# Patient Record
Sex: Male | Born: 1960 | Race: White | Hispanic: No | Marital: Single | Smoking: Never smoker
Health system: Southern US, Community
[De-identification: ages and names within clinical notes are randomized; demographics above are authoritative.]

## PROBLEM LIST (undated history)

## (undated) DIAGNOSIS — E785 Hyperlipidemia, unspecified: Secondary | ICD-10-CM

## (undated) DIAGNOSIS — Z8249 Family history of ischemic heart disease and other diseases of the circulatory system: Secondary | ICD-10-CM

## (undated) DIAGNOSIS — G4700 Insomnia, unspecified: Secondary | ICD-10-CM

## (undated) DIAGNOSIS — I10 Essential (primary) hypertension: Secondary | ICD-10-CM

## (undated) HISTORY — PX: COLONOSCOPY: SHX174

## (undated) HISTORY — DX: Insomnia, unspecified: G47.00

## (undated) HISTORY — DX: Hyperlipidemia, unspecified: E78.5

## (undated) HISTORY — DX: Essential (primary) hypertension: I10

## (undated) HISTORY — DX: Family history of ischemic heart disease and other diseases of the circulatory system: Z82.49

---

## 2002-08-19 HISTORY — PX: TRANSTHORACIC ECHOCARDIOGRAM: SHX275

## 2003-03-29 ENCOUNTER — Ambulatory Visit (HOSPITAL_COMMUNITY): Admission: RE | Admit: 2003-03-29 | Discharge: 2003-03-29 | Payer: Self-pay | Admitting: Family Medicine

## 2003-03-29 ENCOUNTER — Encounter: Payer: Self-pay | Admitting: Family Medicine

## 2005-05-28 ENCOUNTER — Ambulatory Visit (HOSPITAL_COMMUNITY): Admission: RE | Admit: 2005-05-28 | Discharge: 2005-05-28 | Payer: Self-pay | Admitting: Pediatrics

## 2005-05-31 HISTORY — PX: CARDIAC CATHETERIZATION: SHX172

## 2009-02-16 HISTORY — PX: NM MYOCAR PERF WALL MOTION: HXRAD629

## 2013-02-23 NOTE — H&P (Signed)
  NTS SOAP Note  Vital Signs:  Vitals as of: 02/23/2013: Systolic 124: Diastolic 76: Heart Rate 71: Temp 95.26F: Height 2ft 11in: Weight 189Lbs 0 Ounces: BMI 26.36  BMI : 26.36 kg/m2  Subjective: This 52 Years 50 Months old Male presents for screening TCS and dysphagia.  Never has had a colonoscopy.  No family h/o colon cancer.  Denies any lower gi complaints.  Has had some dysphagia of solid food over the past few months, intermittent, but not worsening.  No emesis noted.  Seems to get stuck in his throat.   Review of Symptoms:  Constitutional:unremarkable   Head:unremarkable    Eyes:unremarkable   Nose/Mouth/Throat:unremarkable Cardiovascular:  unremarkable   Respiratory:unremarkable   Gastrointestinal:    heartburn,dysphagia Genitourinary:unremarkable     Musculoskeletal:unremarkable   Skin:unremarkable Hematolgic/Lymphatic:unremarkable     Allergic/Immunologic:unremarkable     Past Medical History:    Reviewed   Past Medical History  Surgical History: none Medical Problems:  High cholesterol Allergies: nkda Medications: fenofibrate, fish oil, ambien, crestor, baby asa   Social History:Reviewed  Social History  Preferred Language: English Race:  White Ethnicity: Not Hispanic / Latino Age: 52 Years 3 Months Marital Status:  S Alcohol: socially Recreational drug(s):  No   Smoking Status: Never smoker reviewed on 02/23/2013 Functional Status reviewed on mm/dd/yyyy ------------------------------------------------ Bathing: Normal Cooking: Normal Dressing: Normal Driving: Normal Eating: Normal Managing Meds: Normal Oral Care: Normal Shopping: Normal Toileting: Normal Transferring: Normal Walking: Normal Cognitive Status reviewed on mm/dd/yyyy ------------------------------------------------ Attention: Normal Decision Making: Normal Language: Normal Memory: Normal Motor: Normal Perception: Normal Problem  Solving: Normal Visual and Spatial: Normal   Family History:  Reviewed  Family Health History Mother, Living; Heart disease;  Father, Living; Heart disease;     Objective Information: General:  Well appearing, well nourished in no distress. Neck:  Supple without lymphadenopathy.  Heart:  RRR, no murmur Lungs:    CTA bilaterally, no wheezes, rhonchi, rales.  Breathing unlabored. Abdomen:Soft, NT/ND, no HSM, no masses.   deferred to procedure  Assessment:Need for screening TCS, dysphagia  Diagnosis &amp; Procedure Smart Code   Plan:Scheduled for EGD, TCS on 03/09/13.   Patient Education:Alternative treatments to surgery were discussed with patient (and family).  Risks and benefits  of procedure were fully explained to the patient (and family) who gave informed consent. Patient/family questions were addressed.  Follow-up:Pending Surgery

## 2013-03-09 ENCOUNTER — Ambulatory Visit (HOSPITAL_COMMUNITY): Admission: RE | Admit: 2013-03-09 | Payer: BC Managed Care – PPO | Source: Ambulatory Visit | Admitting: General Surgery

## 2013-03-09 ENCOUNTER — Encounter (HOSPITAL_COMMUNITY): Admission: RE | Payer: Self-pay | Source: Ambulatory Visit

## 2013-03-09 SURGERY — COLONOSCOPY
Anesthesia: Moderate Sedation

## 2013-08-09 ENCOUNTER — Other Ambulatory Visit: Payer: Self-pay | Admitting: *Deleted

## 2013-08-09 MED ORDER — ROSUVASTATIN CALCIUM 10 MG PO TABS: 10.0000 mg | ORAL_TABLET | Freq: Every day | ORAL | Status: DC

## 2013-08-09 NOTE — Telephone Encounter (Signed)
Rx was sent to pharmacy electronically. 

## 2013-09-13 ENCOUNTER — Encounter: Payer: Self-pay | Admitting: *Deleted

## 2013-09-20 ENCOUNTER — Encounter: Payer: Self-pay | Admitting: Internal Medicine

## 2013-09-21 ENCOUNTER — Ambulatory Visit (INDEPENDENT_AMBULATORY_CARE_PROVIDER_SITE_OTHER): Payer: BC Managed Care – PPO | Admitting: Internal Medicine

## 2013-09-21 ENCOUNTER — Other Ambulatory Visit: Payer: Self-pay

## 2013-09-21 ENCOUNTER — Encounter: Payer: Self-pay | Admitting: Internal Medicine

## 2013-09-21 VITALS — BP 128/96 | HR 73 | Ht 71.0 in | Wt 189.7 lb

## 2013-09-21 DIAGNOSIS — Z8249 Family history of ischemic heart disease and other diseases of the circulatory system: Secondary | ICD-10-CM

## 2013-09-21 DIAGNOSIS — E785 Hyperlipidemia, unspecified: Secondary | ICD-10-CM

## 2013-09-21 MED ORDER — FENOFIBRATE 145 MG PO TABS: 145.0000 mg | ORAL_TABLET | Freq: Every day | ORAL | Status: DC

## 2013-09-21 NOTE — Patient Instructions (Addendum)
Your physician recommends that you return for lab work at your earliest convenience. We will call you with the results. NMR with lipids - please order this to be done prior to annual visits.   Your physician wants you to follow-up in: 1 year. You will receive a reminder letter in the mail two months in advance. If you don't receive a letter, please call our office to schedule the follow-up appointment.

## 2013-09-21 NOTE — Progress Notes (Signed)
OFFICE NOTE  Chief Complaint:  No complaints  Primary Care Physician: Colette Ribas, MD  HPI:  Russell Watkins is a pleasant 53 year old gentleman with a history of coronary disease in the family, dyslipidemia and hypertension. However, he had marked weight loss and is no longer hypertensive. He has been on Crestor 10 mg which was changed to from pravastatin after a lipid profile in January 2013 which showed LDL particle number of 1486, an LDL cholesterol of 62, triglycerides 246, and LDL particle number of 877. Overall, fairly good control; however, there are still a higher number of LDL particles. He also was started on fenofibrate 145 for the increased triglycerides and seems to be tolerating the medicines well. Overall, he has no complaints. A repeat lipid profile performed in January 2014 demonstrated an LDL particle number of 1073, LDL-C. of 69, HDL-C. 45 and triglycerides at 117. This demonstrates excellent control.  Otherwise he has done well over the past year denies any chest pain or shortness of breath with exertion.  PMHx:  Past Medical History  Diagnosis Date  . Family history of heart disease   . Dyslipidemia   . Hypertension   . Insomnia     Past Surgical History  Procedure Laterality Date  . Transthoracic echocardiogram  2004    RV mildly dilated, RA mildly dilated, mild TR  . Nm myocar perf wall motion  02/2009    bruce myoview; no evidence of significant ischemia, normal pattern of perfusion, post-stress EF 70%  . Cardiac catheterization  05/31/2005    normal LV systolic function, normal coronaries (Dr. Evlyn Courier)    FAMHx:  Family History  Problem Relation Age of Onset  . Coronary artery disease Mother     CABG  . Stroke Paternal Grandfather     SOCHx:   reports that he has never smoked. He has never used smokeless tobacco. He reports that he drinks alcohol. He reports that he does not use illicit drugs.  ALLERGIES:  No Known  Allergies  ROS: A comprehensive review of systems was negative.  HOME MEDS: Current Outpatient Prescriptions  Medication Sig Dispense Refill  . ascorbic acid (VITAMIN C) 500 MG tablet Take 500 mg by mouth daily.      . Cyanocobalamin (VITAMIN B 12 PO) Take 2 tablets by mouth daily.      . Omega-3 Fatty Acids (FISH OIL PO) Take 1-2 capsules by mouth daily.      . rosuvastatin (CRESTOR) 10 MG tablet Take 1 tablet (10 mg total) by mouth at bedtime.  30 tablet  1  . zolpidem (AMBIEN) 10 MG tablet Take 5 mg by mouth at bedtime as needed for sleep.      . fenofibrate (TRICOR) 145 MG tablet Take 1 tablet (145 mg total) by mouth at bedtime.  30 tablet  11   No current facility-administered medications for this visit.    LABS/IMAGING: No results found for this or any previous visit (from the past 48 hour(s)). No results found.  VITALS: BP 128/96  Pulse 73  Ht 5\' 11"  (1.803 m)  Wt 189 lb 11.2 oz (86.047 kg)  BMI 26.47 kg/m2  EXAM: General appearance: alert and no distress Neck: no carotid bruit and no JVD Lungs: clear to auscultation bilaterally Heart: regular rate and rhythm, S1, S2 normal, no murmur, click, rub or gallop Abdomen: soft, non-tender; bowel sounds normal; no masses,  no organomegaly Extremities: extremities normal, atraumatic, no cyanosis or edema Pulses: 2+ and symmetric  Skin: Skin color, texture, turgor normal. No rashes or lesions Neurologic: Grossly normal Psych: Mood, affect normal  EKG: Normal sinus rhythm at 70  ASSESSMENT: 1. Dyslipidemia-at goal 2. Family history of CAD  PLAN: 1.   Russell Watkins is doing well and has no new issues. We will go ahead and recheck a lipid and MR today and contact him with results of that study.  Hopefully we'll not need to adjust his medications.  Plan to see him back annually or sooner is necessary.  Chrystie NoseKenneth C. [name], MD, [facility] CenterFACC Attending Cardiologist [facility]  [name],Kenneth C 09/21/2013, 5:35 PM

## 2013-09-21 NOTE — Telephone Encounter (Signed)
Rx was sent to pharmacy electronically. 

## 2013-10-25 ENCOUNTER — Other Ambulatory Visit: Payer: Self-pay | Admitting: *Deleted

## 2013-10-25 MED ORDER — ROSUVASTATIN CALCIUM 10 MG PO TABS: 10.0000 mg | ORAL_TABLET | Freq: Every day | ORAL | Status: DC

## 2013-10-25 NOTE — Telephone Encounter (Signed)
Rx was sent to pharmacy electronically. 

## 2014-11-13 ENCOUNTER — Other Ambulatory Visit: Payer: Self-pay | Admitting: Internal Medicine

## 2014-11-14 NOTE — Telephone Encounter (Signed)
Rx refill sent to patient pharmacy   

## 2014-12-02 ENCOUNTER — Telehealth: Payer: Self-pay | Admitting: Internal Medicine

## 2014-12-05 NOTE — Telephone Encounter (Signed)
Close encounter 

## 2014-12-08 ENCOUNTER — Other Ambulatory Visit: Payer: Self-pay | Admitting: Internal Medicine

## 2014-12-11 LAB — NMR LIPOPROFILE WITH LIPIDS
CHOLESTEROL, TOTAL: 148 mg/dL (ref 100–199)
HDL PARTICLE NUMBER: 33.4 umol/L (ref >=30.5)
HDL Size: 8.3 nm — ABNORMAL LOW (ref >=9.2)
HDL-C: 47 mg/dL (ref >39)
LARGE VLDL-P: 4.2 nmol/L — AB (ref <=2.7)
LDL CALC: 80 mg/dL (ref 0–99)
LDL PARTICLE NUMBER: 1374 nmol/L — AB (ref <1000)
LDL Size: 20.7 nm (ref >=20.8)
LP-IR Score: 84 — ABNORMAL HIGH (ref <=45)
Large HDL-P: 1.4 umol/L — ABNORMAL LOW (ref >=4.8)
SMALL LDL PARTICLE NUMBER: 554 nmol/L — AB (ref <=527)
Triglycerides: 103 mg/dL (ref 0–149)
VLDL Size: 56.1 nm — ABNORMAL HIGH (ref <=46.6)

## 2014-12-14 ENCOUNTER — Encounter: Payer: Self-pay | Admitting: *Deleted

## 2014-12-20 ENCOUNTER — Other Ambulatory Visit: Payer: Self-pay | Admitting: Internal Medicine

## 2015-01-09 ENCOUNTER — Ambulatory Visit (INDEPENDENT_AMBULATORY_CARE_PROVIDER_SITE_OTHER): Payer: BLUE CROSS/BLUE SHIELD | Admitting: Internal Medicine

## 2015-01-09 ENCOUNTER — Encounter: Payer: Self-pay | Admitting: Internal Medicine

## 2015-01-09 VITALS — BP 110/72 | HR 70 | Ht 71.0 in | Wt 189.5 lb

## 2015-01-09 DIAGNOSIS — E785 Hyperlipidemia, unspecified: Secondary | ICD-10-CM | POA: Diagnosis not present

## 2015-01-09 DIAGNOSIS — Z8249 Family history of ischemic heart disease and other diseases of the circulatory system: Secondary | ICD-10-CM

## 2015-01-09 NOTE — Progress Notes (Signed)
OFFICE NOTE  Chief Complaint:  No complaints  Primary Care Physician: Colette Ribas, MD  HPI:  Russell Watkins is a pleasant 54 year old gentleman with a history of coronary disease in the family, dyslipidemia and hypertension. However, he had marked weight loss and is no longer hypertensive. He has been on Crestor 10 mg which was changed to from pravastatin after a lipid profile in January 2013 which showed LDL particle number of 1486, an LDL cholesterol of 62, triglycerides 246, and LDL particle number of 877. Overall, fairly good control; however, there are still a higher number of LDL particles. He also was started on fenofibrate 145 for the increased triglycerides and seems to be tolerating the medicines well. Overall, he has no complaints. A repeat lipid profile performed in January 2014 demonstrated an LDL particle number of 1073, LDL-C. of 69, HDL-C. 45 and triglycerides at 117. This demonstrates excellent control.  Otherwise he has done well over the past year denies any chest pain or shortness of breath with exertion.  Russell Watkins is doing well overall. Denies a chest pain or shortness of breath. He remains active. Repeat cholesterol testing indicates a slightly higher particle number with LDL-P of 1374, the LDL C is 80, HDL 47 and triglycerides of 103. This is still an overall favorable profile however particle numbers are up. This is primarily due to high the VLDL number. He'll will need to continue to work on diet and exercise. This point is no indication to change his medications.  PMHx:  Past Medical History  Diagnosis Date  . Family history of heart disease   . Dyslipidemia   . Hypertension   . Insomnia     Past Surgical History  Procedure Laterality Date  . Transthoracic echocardiogram  2004    RV mildly dilated, RA mildly dilated, mild TR  . Nm myocar perf wall motion  02/2009    bruce myoview; no evidence of significant ischemia, normal pattern of perfusion,  post-stress EF 70%  . Cardiac catheterization  05/31/2005    normal LV systolic function, normal coronaries (Dr. Evlyn Courier)    FAMHx:  Family History  Problem Relation Age of Onset  . Coronary artery disease Mother     CABG  . Stroke Paternal Grandfather     SOCHx:   reports that he has never smoked. He has never used smokeless tobacco. He reports that he drinks alcohol. He reports that he does not use illicit drugs.  ALLERGIES:  No Known Allergies  ROS: A comprehensive review of systems was negative.  HOME MEDS: Current Outpatient Prescriptions  Medication Sig Dispense Refill  . ascorbic acid (VITAMIN C) 500 MG tablet Take 500 mg by mouth daily.    . CRESTOR 10 MG tablet take 1 tablet by mouth at bedtime 30 tablet 0  . Cyanocobalamin (VITAMIN B 12 PO) Take 2 tablets by mouth daily.    . fenofibrate (TRICOR) 145 MG tablet take 1 tablet by mouth at bedtime 30 tablet 0  . Omega-3 Fatty Acids (FISH OIL PO) Take 1-2 capsules by mouth daily.    Marland Kitchen zolpidem (AMBIEN) 10 MG tablet Take 5 mg by mouth at bedtime as needed for sleep.     No current facility-administered medications for this visit.    LABS/IMAGING: No results found for this or any previous visit (from the past 48 hour(s)). No results found.  VITALS: BP 110/72 mmHg  Pulse 70  Ht  (1.803 m)  Wt 189 lb  8 oz (85.957 kg)  BMI 26.44 kg/m2  EXAM: General appearance: alert and no distress Neck: no carotid bruit and no JVD Lungs: clear to auscultation bilaterally Heart: regular rate and rhythm, S1, S2 normal, no murmur, click, rub or gallop Abdomen: soft, non-tender; bowel sounds normal; no masses,  no organomegaly Extremities: extremities normal, atraumatic, no cyanosis or edema Pulses: 2+ and symmetric Skin: Skin color, texture, turgor normal. No rashes or lesions Neurologic: Grossly normal Psych: Mood, affect normal  EKG: Normal sinus rhythm at 70  ASSESSMENT: 1. Dyslipidemia-at goal 2. Family history  of CAD  PLAN: 1.   Russell Watkins is doing well and has no new issues. We will go ahead and recheck a lipid NMR today prior to his visit in 1 year. Hopefully we'll not need to adjust his medications.  Plan to see him back annually or sooner is necessary.  Chrystie NoseKenneth C. Evie Croston, MD, [facility]ACC Attending Cardiologist [facility]  Chrystie NoseKenneth C Byran Bilotti 01/09/2015, 9:54 AM

## 2015-01-09 NOTE — Patient Instructions (Signed)
Your physician wants you to follow-up in: 1 year with Dr. Rennis GoldenHilty. You will receive a reminder letter in the mail two months in advance. If you don't receive a letter, please call our office to schedule the follow-up appointment.  >> please have fasting labs in 1 year prior to your next visit

## 2015-02-07 ENCOUNTER — Other Ambulatory Visit: Payer: Self-pay | Admitting: Internal Medicine

## 2015-11-09 ENCOUNTER — Other Ambulatory Visit: Payer: Self-pay | Admitting: *Deleted

## 2015-11-09 DIAGNOSIS — E785 Hyperlipidemia, unspecified: Secondary | ICD-10-CM

## 2015-12-14 ENCOUNTER — Other Ambulatory Visit: Payer: Self-pay | Admitting: Internal Medicine

## 2015-12-14 NOTE — Telephone Encounter (Signed)
Rx has been sent to the pharmacy electronically. ° °

## 2016-01-25 ENCOUNTER — Telehealth: Payer: Self-pay | Admitting: Internal Medicine

## 2016-01-25 NOTE — Telephone Encounter (Signed)
Error

## 2016-02-09 DIAGNOSIS — E785 Hyperlipidemia, unspecified: Secondary | ICD-10-CM | POA: Diagnosis not present

## 2016-02-13 ENCOUNTER — Ambulatory Visit (INDEPENDENT_AMBULATORY_CARE_PROVIDER_SITE_OTHER): Payer: BLUE CROSS/BLUE SHIELD | Admitting: Internal Medicine

## 2016-02-13 ENCOUNTER — Encounter: Payer: Self-pay | Admitting: Internal Medicine

## 2016-02-13 VITALS — BP 102/74 | HR 69 | Ht 71.0 in | Wt 190.2 lb

## 2016-02-13 DIAGNOSIS — E785 Hyperlipidemia, unspecified: Secondary | ICD-10-CM

## 2016-02-13 DIAGNOSIS — Z8249 Family history of ischemic heart disease and other diseases of the circulatory system: Secondary | ICD-10-CM

## 2016-02-13 LAB — CARDIO IQ(R) ADVANCED LIPID PANEL
Apolipoprotein B: 87 mg/dL (ref 52–109)
Cholesterol, Total: 160 mg/dL (ref 125–200)
Cholesterol/HDL Ratio: 3.8 calc (ref <=5.0)
HDL Cholesterol: 42 mg/dL (ref >=40)
LDL Large: 3254 nmol/L — ABNORMAL LOW (ref 4334–10815)
LDL Medium: 281 nmol/L (ref 167–465)
LDL Particle Number: 1032 nmol/L (ref 1016–2185)
LDL Peak Size: 217.4 Angstrom — ABNORMAL LOW (ref >=218.2)
LDL Small: 229 nmol/L (ref 123–441)
LDL, Calculated: 100 mg/dL
LIPOPROTEIN (A) (CARDIO IQ ADV LIPID PANEL): 276 nmol/L — AB (ref <75)
Non-HDL Cholesterol: 118 mg/dL
Triglycerides: 92 mg/dL

## 2016-02-13 NOTE — Patient Instructions (Signed)
Medication Instructions:  Continue current medications  Labwork: Fasting Lipids  Testing/Procedures: NONE  Follow-Up: Your physician wants you to follow-up in: 1 Year. You will receive a reminder letter in the mail two months in advance. If you don't receive a letter, please call our office to schedule the follow-up appointment.   Any Other Special Instructions Will Be Listed Below (If Applicable).   If you need a refill on your cardiac medications before your next appointment, please call your pharmacy.   

## 2016-02-13 NOTE — Progress Notes (Signed)
OFFICE NOTE  Chief Complaint:  No complaints  Primary Care Physician: Colette RibasGOLDING, JOHN CABOT, MD  HPI:  Russell Watkins is a pleasant [age] year old gentleman with a history of coronary disease in the family, dyslipidemia and hypertension. However, he had marked weight loss and is no longer hypertensive. He has been on Crestor 10 mg which was changed to from pravastatin after a lipid profile in January 2013 which showed LDL particle number of 1486, an LDL cholesterol of 62, triglycerides 246, and LDL particle number of 877. Overall, fairly good control; however, there are still a higher number of LDL particles. He also was started on fenofibrate 145 for the increased triglycerides and seems to be tolerating the medicines well. Overall, he has no complaints. A repeat lipid profile performed in January 2014 demonstrated an LDL particle number of 1073, LDL-C. of 69, HDL-C. 45 and triglycerides at 117. This demonstrates excellent control.  Otherwise he has done well over the past year denies any chest pain or shortness of breath with exertion.  Russell Watkins is doing well overall. Denies a chest pain or shortness of breath. He remains active. Repeat cholesterol testing indicates a slightly higher particle number with LDL-P of 1374, the LDL C is 80, HDL 47 and triglycerides of 103. This is still an overall favorable profile however particle numbers are up. This is primarily due to high the VLDL number. He'll will need to continue to work on diet and exercise. This point is no indication to change his medications.  02/13/2016  Russell Watkins was seen back in the office today for follow-up. Overall he seems to be doing quite well. He denies any chest pain or worsening shortness of breath. He maintains an active lifestyle. He recently had a lipid profile drawn on 02/09/2016 however those results are still pending. There have been no new occurrences with regards to his health history over the past year.  PMHx:    Past Medical History  Diagnosis Date  . Family history of heart disease   . Dyslipidemia   . Hypertension   . Insomnia     Past Surgical History  Procedure Laterality Date  . Transthoracic echocardiogram  2004    RV mildly dilated, RA mildly dilated, mild TR  . Nm myocar perf wall motion  02/2009    bruce myoview; no evidence of significant ischemia, normal pattern of perfusion, post-stress EF 70%  . Cardiac catheterization  05/31/2005    normal LV systolic function, normal coronaries (Dr. Evlyn CourierJ. Gangi)    FAMHx:  Family History  Problem Relation Age of Onset  . Coronary artery disease Mother     CABG  . Stroke Paternal Grandfather     SOCHx:   reports that he has never smoked. He has never used smokeless tobacco. He reports that he drinks alcohol. He reports that he does not use illicit drugs.  ALLERGIES:  No Known Allergies  ROS: A comprehensive review of systems was negative.  HOME MEDS: Current Outpatient Prescriptions  Medication Sig Dispense Refill  . ascorbic acid (VITAMIN C) 500 MG tablet Take 500 mg by mouth daily.    . Cyanocobalamin (VITAMIN B 12 PO) Take 2 tablets by mouth daily.    . fenofibrate (TRICOR) 145 MG tablet take 1 tablet by mouth at bedtime 30 tablet 1  . Omega-3 Fatty Acids (FISH OIL PO) Take 1-2 capsules by mouth daily.    . rosuvastatin (CRESTOR) 10 MG tablet take 1 tablet by mouth at bedtime 30  tablet 1  . zolpidem (AMBIEN) 10 MG tablet Take 5 mg by mouth at bedtime as needed for sleep.     No current facility-administered medications for this visit.    LABS/IMAGING: No results found for this or any previous visit (from the past 48 hour(s)). No results found.  VITALS: BP 102/74 mmHg  Pulse 69  Ht 5\' 11"  (1.803 m)  Wt 190 lb 3.2 oz (86.274 kg)  BMI 26.54 kg/m2  SpO2 98%  EXAM: General appearance: alert and no distress Neck: no carotid bruit and no JVD Lungs: clear to auscultation bilaterally Heart: regular rate and rhythm, S1, S2  normal, no murmur, click, rub or gallop Abdomen: soft, non-tender; bowel sounds normal; no masses,  no organomegaly Extremities: extremities normal, atraumatic, no cyanosis or edema Pulses: 2+ and symmetric Skin: Skin color, texture, turgor normal. No rashes or lesions Neurologic: Grossly normal Psych: Mood, affect normal  EKG: Normal sinus rhythm at 69  ASSESSMENT: 1. Dyslipidemia-lipids pending 2. Family history of CAD  PLAN: 1.   Russell Watkins is doing well and has no new issues. We will go ahead and recheck a lipid NMR today prior to his visit in 1 year. I will contact him with results of the most recent lipid profile which are still pending. Hopefully we'll not need to adjust his medications.  Plan to see him back annually or sooner is necessary.  Chrystie NoseKenneth C. [name], MD, [facility]ACC Attending Cardiologist [facility]  Chrystie NoseKenneth C [name] 02/13/2016, 8:52 AM

## 2016-02-14 ENCOUNTER — Encounter: Payer: Self-pay | Admitting: Internal Medicine

## 2016-02-14 ENCOUNTER — Other Ambulatory Visit: Payer: Self-pay | Admitting: *Deleted

## 2016-02-14 DIAGNOSIS — E785 Hyperlipidemia, unspecified: Secondary | ICD-10-CM

## 2016-02-29 ENCOUNTER — Other Ambulatory Visit: Payer: Self-pay | Admitting: Internal Medicine

## 2016-03-06 ENCOUNTER — Other Ambulatory Visit: Payer: Self-pay | Admitting: Internal Medicine

## 2016-05-09 DIAGNOSIS — H524 Presbyopia: Secondary | ICD-10-CM | POA: Diagnosis not present

## 2016-05-13 DIAGNOSIS — Z23 Encounter for immunization: Secondary | ICD-10-CM | POA: Diagnosis not present

## 2016-06-11 DIAGNOSIS — Z23 Encounter for immunization: Secondary | ICD-10-CM | POA: Diagnosis not present

## 2016-07-04 DIAGNOSIS — E782 Mixed hyperlipidemia: Secondary | ICD-10-CM | POA: Diagnosis not present

## 2016-07-04 DIAGNOSIS — Z1389 Encounter for screening for other disorder: Secondary | ICD-10-CM | POA: Diagnosis not present

## 2016-07-04 DIAGNOSIS — E663 Overweight: Secondary | ICD-10-CM | POA: Diagnosis not present

## 2016-07-04 DIAGNOSIS — Z0000 Encounter for general adult medical examination without abnormal findings: Secondary | ICD-10-CM | POA: Diagnosis not present

## 2016-07-04 DIAGNOSIS — Z6827 Body mass index (BMI) 27.0-27.9, adult: Secondary | ICD-10-CM | POA: Diagnosis not present

## 2016-07-26 ENCOUNTER — Other Ambulatory Visit: Payer: Self-pay | Admitting: Internal Medicine

## 2016-07-26 NOTE — Telephone Encounter (Signed)
Rx(s) sent to pharmacy electronically.  

## 2016-12-23 ENCOUNTER — Telehealth: Payer: Self-pay | Admitting: *Deleted

## 2016-12-23 DIAGNOSIS — E785 Hyperlipidemia, unspecified: Secondary | ICD-10-CM

## 2016-12-23 NOTE — Telephone Encounter (Signed)
Patient called requesting labs prior to follow up ov Per last office note ok to get labs prior to ov, order place for Cardio [facility]

## 2016-12-26 DIAGNOSIS — E785 Hyperlipidemia, unspecified: Secondary | ICD-10-CM | POA: Diagnosis not present

## 2016-12-30 ENCOUNTER — Ambulatory Visit (INDEPENDENT_AMBULATORY_CARE_PROVIDER_SITE_OTHER): Payer: BLUE CROSS/BLUE SHIELD | Admitting: Internal Medicine

## 2016-12-30 ENCOUNTER — Encounter: Payer: Self-pay | Admitting: Internal Medicine

## 2016-12-30 VITALS — BP 119/79 | HR 71 | Ht 70.5 in | Wt 192.4 lb

## 2016-12-30 DIAGNOSIS — E785 Hyperlipidemia, unspecified: Secondary | ICD-10-CM | POA: Diagnosis not present

## 2016-12-30 DIAGNOSIS — J302 Other seasonal allergic rhinitis: Secondary | ICD-10-CM

## 2016-12-30 DIAGNOSIS — Z8249 Family history of ischemic heart disease and other diseases of the circulatory system: Secondary | ICD-10-CM | POA: Diagnosis not present

## 2016-12-30 NOTE — Patient Instructions (Signed)
Medication Instructions: Your physician recommends that you continue on your current medications as directed. Please refer to the Current Medication list given to you today.  Use over-the-counter Flonase nasal spray for seasonal allergies--follow instructions as given on the bottle.  Follow-Up: Your physician wants you to follow-up in: 1 year with Dr. Rennis GoldenHilty. You will receive a reminder letter in the mail two months in advance. If you don't receive a letter, please call our office to schedule the follow-up appointment.  If you need a refill on your cardiac medications before your next appointment, please call your pharmacy.

## 2016-12-30 NOTE — Progress Notes (Signed)
OFFICE NOTE  Chief Complaint:  Sneezing, seasonal allergies  Primary Care Physician: Assunta Found, MD  HPI:  Russell Watkins is a pleasant 56 year old gentleman with a history of coronary disease in the family, dyslipidemia and hypertension. However, he had marked weight loss and is no longer hypertensive. He has been on Crestor 10 mg which was changed to from pravastatin after a lipid profile in January 2013 which showed LDL particle number of 1486, an LDL cholesterol of 62, triglycerides 246, and LDL particle number of 877. Overall, fairly good control; however, there are still a higher number of LDL particles. He also was started on fenofibrate 145 for the increased triglycerides and seems to be tolerating the medicines well. Overall, he has no complaints. A repeat lipid profile performed in January 2014 demonstrated an LDL particle number of 1073, LDL-C. of 69, HDL-C. 45 and triglycerides at 117. This demonstrates excellent control.  Otherwise he has done well over the past year denies any chest pain or shortness of breath with exertion.  Russell Watkins is doing well overall. Denies a chest pain or shortness of breath. He remains active. Repeat cholesterol testing indicates a slightly higher particle number with LDL-P of 1374, the LDL C is 80, HDL 47 and triglycerides of 103. This is still an overall favorable profile however particle numbers are up. This is primarily due to high the VLDL number. He'll will need to continue to work on diet and exercise. This point is no indication to change his medications.  02/13/2016  Russell Watkins was seen back in the office today for follow-up. Overall he seems to be doing quite well. He denies any chest pain or worsening shortness of breath. He maintains an active lifestyle. He recently had a lipid profile drawn on 02/09/2016 however those results are still pending. There have been no new occurrences with regards to his health history over the past  year.  12/30/2016  Russell Watkins returns today for follow-up. Overall seems to be doing very well. He suffering from some seasonal allergies including itchy eyes and runny nose. He recently had repeat lab work looking at his cholesterol profile however that still pending. Otherwise is asymptomatic. Denies any chest pain or worsening shortness of breath with exertion. Weight is been fairly stable although he wants to lose more weight. He intends to start more regular exercise as well as playing more golf.  PMHx:  Past Medical History:  Diagnosis Date  . Dyslipidemia   . Family history of heart disease   . Hypertension   . Insomnia     Past Surgical History:  Procedure Laterality Date  . CARDIAC CATHETERIZATION  05/31/2005   normal LV systolic function, normal coronaries (Dr. Evlyn Courier)  . NM MYOCAR PERF WALL MOTION  02/2009   bruce myoview; no evidence of significant ischemia, normal pattern of perfusion, post-stress EF 70%  . TRANSTHORACIC ECHOCARDIOGRAM  2004   RV mildly dilated, RA mildly dilated, mild TR    FAMHx:  Family History  Problem Relation Age of Onset  . Coronary artery disease Mother        CABG  . Stroke Paternal Grandfather     SOCHx:   reports that he has never smoked. He has never used smokeless tobacco. He reports that he drinks alcohol. He reports that he does not use drugs.  ALLERGIES:  No Known Allergies  ROS: Pertinent items noted in HPI and remainder of comprehensive ROS otherwise negative.  HOME MEDS: Current Outpatient Prescriptions  Medication Sig Dispense Refill  . ascorbic acid (VITAMIN C) 500 MG tablet Take 500 mg by mouth daily.    . Cyanocobalamin (VITAMIN B 12 PO) Take 2 tablets by mouth daily.    . fenofibrate (TRICOR) 145 MG tablet take 1 tablet by mouth once daily at bedtime 30 tablet 6  . Omega-3 Fatty Acids (FISH OIL PO) Take 1-2 capsules by mouth daily.    . rosuvastatin (CRESTOR) 10 MG tablet take 1 tablet by mouth once daily at  bedtime 30 tablet 6  . zolpidem (AMBIEN) 10 MG tablet Take 5 mg by mouth at bedtime as needed for sleep.     No current facility-administered medications for this visit.     LABS/IMAGING: No results found for this or any previous visit (from the past 48 hour(s)). No results found.  VITALS: BP 119/79   Pulse 71   Ht 5' 10.5" (1.791 m)   Wt 192 lb 6.4 oz (87.3 kg)   BMI 27.22 kg/m   EXAM: General appearance: alert, no distress and thin Neck: no carotid bruit, no JVD and thyroid not enlarged, symmetric, no tenderness/mass/nodules Lungs: clear to auscultation bilaterally Heart: regular rate and rhythm, S1, S2 normal, no murmur, click, rub or gallop Abdomen: soft, non-tender; bowel sounds normal; no masses,  no organomegaly Extremities: extremities normal, atraumatic, no cyanosis or edema Pulses: 2+ and symmetric Skin: Skin color, texture, turgor normal. No rashes or lesions Neurologic: Mental status: Alert, oriented, thought content appropriate Psych: Pleasant  EKG: Deferred  ASSESSMENT: 1. Dyslipidemia-lipids pending 2. Family history of CAD 3. Seasonal allergic rhinitis  PLAN: 1.   Russell Watkins is doing well and has no new issues. He recently had lab work on for his cholesterol which will call him about when the results are available. Otherwise we'll continue his current medications. He has no new cardiac vascular symptoms. He suffering from seasonal allergic rhinitis. I suggested using Flonase nasal spray which is over-the-counter and if he has significant allergies considering an over-the-counter antihistamine such as Allegra.  Follow-up annually or sooner as needed.  Russell NoseKenneth C. [name], MD, [facility] Attending Cardiologist [facility]  Russell NoseKenneth C Watkins 12/30/2016, 9:47 AM

## 2017-01-01 LAB — CARDIO IQ(R) ADVANCED LIPID PANEL
APOLIPOPROTEIN (CARDIO IQ ADV LIPID PANEL): 73 mg/dL (ref 52–109)
CHOLESTEROL, TOTAL (CARDIO IQ ADV LIPID PANEL): 137 mg/dL (ref <200)
CHOLESTEROL/HDL RATIO (CARDIO IQ ADV LIPID PANEL): 3.3 calc (ref <5.0)
HDL CHOLESTEROL (CARDIO IQ ADV LIPID PANEL): 41 mg/dL (ref >40)
LDL CHOLESTEROL CALCULATED (CARDIO IQ ADV LIPID PANEL): 79 mg/dL (ref <100)
LDL Large: 4027 nmol/L (ref 3382–9376)
LDL Medium: 233 nmol/L (ref 122–498)
LDL PARTICLE NUMBER: 1058 nmol/L (ref 732–2035)
LDL Peak Size: 222.3 Angstrom (ref >=217.4)
LDL Small: 173 nmol/L (ref 85–473)
LIPOPROTEIN (A) (CARDIO IQ ADV LIPID PANEL): 282 nmol/L — AB (ref <75)
NON-HDL CHOLESTEROL (CARDIO IQ ADV LIPID PANEL): 96 mg/dL (ref <130)
TRIGLYCERIDES (CARDIO IQ ADV LIPID PANEL): 89 mg/dL (ref <150)

## 2017-01-03 ENCOUNTER — Encounter: Payer: Self-pay | Admitting: Internal Medicine

## 2017-04-09 ENCOUNTER — Other Ambulatory Visit: Payer: Self-pay | Admitting: Internal Medicine

## 2017-05-02 DIAGNOSIS — E663 Overweight: Secondary | ICD-10-CM | POA: Diagnosis not present

## 2017-05-02 DIAGNOSIS — E782 Mixed hyperlipidemia: Secondary | ICD-10-CM | POA: Diagnosis not present

## 2017-05-02 DIAGNOSIS — G4700 Insomnia, unspecified: Secondary | ICD-10-CM | POA: Diagnosis not present

## 2017-05-02 DIAGNOSIS — Z6827 Body mass index (BMI) 27.0-27.9, adult: Secondary | ICD-10-CM | POA: Diagnosis not present

## 2017-09-17 DIAGNOSIS — G4700 Insomnia, unspecified: Secondary | ICD-10-CM | POA: Diagnosis not present

## 2017-09-17 DIAGNOSIS — E663 Overweight: Secondary | ICD-10-CM | POA: Diagnosis not present

## 2017-09-17 DIAGNOSIS — Z6827 Body mass index (BMI) 27.0-27.9, adult: Secondary | ICD-10-CM | POA: Diagnosis not present

## 2017-09-17 DIAGNOSIS — E782 Mixed hyperlipidemia: Secondary | ICD-10-CM | POA: Diagnosis not present

## 2017-09-19 DIAGNOSIS — K219 Gastro-esophageal reflux disease without esophagitis: Secondary | ICD-10-CM | POA: Diagnosis not present

## 2017-09-19 DIAGNOSIS — E782 Mixed hyperlipidemia: Secondary | ICD-10-CM | POA: Diagnosis not present

## 2017-09-19 DIAGNOSIS — Z0000 Encounter for general adult medical examination without abnormal findings: Secondary | ICD-10-CM | POA: Diagnosis not present

## 2017-09-19 DIAGNOSIS — R7309 Other abnormal glucose: Secondary | ICD-10-CM | POA: Diagnosis not present

## 2017-09-19 DIAGNOSIS — Z1389 Encounter for screening for other disorder: Secondary | ICD-10-CM | POA: Diagnosis not present

## 2018-02-02 ENCOUNTER — Other Ambulatory Visit: Payer: Self-pay | Admitting: Internal Medicine

## 2018-03-12 ENCOUNTER — Other Ambulatory Visit: Payer: Self-pay | Admitting: Internal Medicine

## 2018-03-15 ENCOUNTER — Encounter: Payer: Self-pay | Admitting: Internal Medicine

## 2018-03-31 ENCOUNTER — Other Ambulatory Visit: Payer: Self-pay

## 2018-03-31 MED ORDER — ROSUVASTATIN CALCIUM 10 MG PO TABS: 10.0000 mg | ORAL_TABLET | Freq: Every day | ORAL | 1 refills | Status: DC

## 2018-03-31 MED ORDER — FENOFIBRATE 145 MG PO TABS: 145.0000 mg | ORAL_TABLET | Freq: Every day | ORAL | 1 refills | Status: DC

## 2018-04-20 DIAGNOSIS — E785 Hyperlipidemia, unspecified: Secondary | ICD-10-CM

## 2018-05-05 DIAGNOSIS — E785 Hyperlipidemia, unspecified: Secondary | ICD-10-CM | POA: Diagnosis not present

## 2018-05-06 LAB — NMR, LIPOPROFILE
Cholesterol, Total: 145 mg/dL (ref 100–199)
HDL Particle Number: 36.3 umol/L (ref >=30.5)
HDL-C: 48 mg/dL (ref >39)
LDL PARTICLE NUMBER: 1119 nmol/L — AB (ref <1000)
LDL SIZE: 20.3 nm — AB (ref >20.5)
LDL-C: 85 mg/dL (ref 0–99)
LP-IR SCORE: 63 — AB (ref <=45)
SMALL LDL PARTICLE NUMBER: 664 nmol/L — AB (ref <=527)
TRIGLYCERIDES: 61 mg/dL (ref 0–149)

## 2018-05-07 ENCOUNTER — Ambulatory Visit: Payer: BLUE CROSS/BLUE SHIELD | Admitting: Internal Medicine

## 2018-05-07 ENCOUNTER — Encounter: Payer: Self-pay | Admitting: Internal Medicine

## 2018-05-07 VITALS — BP 104/76 | HR 57 | Ht 71.0 in | Wt 182.0 lb

## 2018-05-07 DIAGNOSIS — E785 Hyperlipidemia, unspecified: Secondary | ICD-10-CM

## 2018-05-07 DIAGNOSIS — Z8249 Family history of ischemic heart disease and other diseases of the circulatory system: Secondary | ICD-10-CM

## 2018-05-07 MED ORDER — FENOFIBRATE 145 MG PO TABS: 145.0000 mg | ORAL_TABLET | Freq: Every day | ORAL | 3 refills | Status: DC

## 2018-05-07 MED ORDER — ROSUVASTATIN CALCIUM 10 MG PO TABS: 10.0000 mg | ORAL_TABLET | Freq: Every day | ORAL | 3 refills | Status: DC

## 2018-05-07 NOTE — Patient Instructions (Signed)
Medication Instructions:  Your physician recommends that you continue on your current medications as directed. Please refer to the Current Medication list given to you today.  Follow-Up: Your physician wants you to follow-up in: 12 months with Dr. [name].  You will receive a reminder letter in the mail two months in advance. If you don't receive a letter, please call our office to schedule the follow-up appointment.   Any Other Special Instructions Will Be Listed Below (If Applicable).     If you need a refill on your cardiac medications before your next appointment, please call your pharmacy.   

## 2018-05-07 NOTE — Progress Notes (Signed)
OFFICE NOTE  Chief Complaint:  No complaints  Primary Care Physician: Assunta Found, MD  HPI:  Russell Watkins is a pleasant 57 year old gentleman with a history of coronary disease in the family, dyslipidemia and hypertension. However, he had marked weight loss and is no longer hypertensive. He has been on Crestor 10 mg which was changed to from pravastatin after a lipid profile in January 2013 which showed LDL particle number of 1486, an LDL cholesterol of 62, triglycerides 246, and LDL particle number of 877. Overall, fairly good control; however, there are still a higher number of LDL particles. He also was started on fenofibrate 145 for the increased triglycerides and seems to be tolerating the medicines well. Overall, he has no complaints. A repeat lipid profile performed in January 2014 demonstrated an LDL particle number of 1073, LDL-C. of 69, HDL-C. 45 and triglycerides at 117. This demonstrates excellent control.  Otherwise he has done well over the past year denies any chest pain or shortness of breath with exertion.  Russell Watkins is doing well overall. Denies a chest pain or shortness of breath. He remains active. Repeat cholesterol testing indicates a slightly higher particle number with LDL-P of 1374, the LDL C is 80, HDL 47 and triglycerides of 103. This is still an overall favorable profile however particle numbers are up. This is primarily due to high the VLDL number. He'll will need to continue to work on diet and exercise. This point is no indication to change his medications.  02/13/2016  Russell Watkins was seen back in the office today for follow-up. Overall he seems to be doing quite well. He denies any chest pain or worsening shortness of breath. He maintains an active lifestyle. He recently had a lipid profile drawn on 02/09/2016 however those results are still pending. There have been no new occurrences with regards to his health history over the past year.  12/30/2016  Russell Watkins returns today for follow-up. Overall seems to be doing very well. He suffering from some seasonal allergies including itchy eyes and runny nose. He recently had repeat lab work looking at his cholesterol profile however that still pending. Otherwise is asymptomatic. Denies any chest pain or worsening shortness of breath with exertion. Weight is been fairly stable although he wants to lose more weight. He intends to start more regular exercise as well as playing more golf.  05/07/2018  Russell Watkins is seen today in follow-up.  This is an annual visit and is without complaints.  He recently moved and is working on setting up home gym.  He says that his diet has not been as adequate recently.  We did repeat recent lipid testing including a lipoprotein NMR.  This indicates slightly worse cholesterol numbers however still reasonably good control.  His LDL-P was 1000, 119.  LDL-C was 85, HDL-C 48, triglycerides 61 and he is a small LDL particle number was 664.  He reports compliance with fenofibrate and rosuvastatin.  His diet may not be as healthy as it had been last year.  PMHx:  Past Medical History:  Diagnosis Date  . Dyslipidemia   . Family history of heart disease   . Hypertension   . Insomnia     Past Surgical History:  Procedure Laterality Date  . CARDIAC CATHETERIZATION  05/31/2005   normal LV systolic function, normal coronaries (Dr. Evlyn Courier)  . NM MYOCAR PERF WALL MOTION  02/2009   bruce myoview; no evidence of significant ischemia, normal pattern  of perfusion, post-stress EF 70%  . TRANSTHORACIC ECHOCARDIOGRAM  2004   RV mildly dilated, RA mildly dilated, mild TR    FAMHx:  Family History  Problem Relation Age of Onset  . Coronary artery disease Mother        CABG  . Stroke Paternal Grandfather     SOCHx:   reports that he has never smoked. He has never used smokeless tobacco. He reports that he drinks alcohol. He reports that he does not use drugs.  ALLERGIES:  No  Known Allergies  ROS: Pertinent items noted in HPI and remainder of comprehensive ROS otherwise negative.  HOME MEDS: Current Outpatient Medications  Medication Sig Dispense Refill  . ascorbic acid (VITAMIN C) 500 MG tablet Take 500 mg by mouth daily.    . Cyanocobalamin (VITAMIN B 12 PO) Take 2 tablets by mouth daily.    . fenofibrate (TRICOR) 145 MG tablet Take 1 tablet (145 mg total) by mouth at bedtime. 30 tablet 1  . Omega-3 Fatty Acids (FISH OIL PO) Take 1-2 capsules by mouth daily.    . rosuvastatin (CRESTOR) 10 MG tablet Take 1 tablet (10 mg total) by mouth at bedtime. 30 tablet 1  . zolpidem (AMBIEN) 10 MG tablet Take 5 mg by mouth at bedtime as needed for sleep.     No current facility-administered medications for this visit.     LABS/IMAGING: Results for orders placed or performed in visit on 04/20/18 (from the past 48 hour(s))  NMR, lipoprofile     Status: Abnormal   Collection Time: 05/05/18  8:21 AM  Result Value Ref Range   LDL Particle Number 1,[id number] (H) <1,000 nmol/L    Comment:                           Low                   < 1000                           Moderate         1000 - 1299                           Borderline-High  1300 - 1599                           High             1600 - 2000                           Very High             > 2000    LDL-C 85 0 - 99 mg/dL    Comment:                           Optimal               <  100                           Above optimal     100 -  129  Borderline        130 -  159                           High              160 -  189                           Very high             >  189 LDL-C is inaccurate if patient is non-fasting.    HDL-C 48 >39 mg/dL   Triglycerides 61 0 - 149 mg/dL   Cholesterol, Total 161 100 - 199 mg/dL   HDL Particle Number [id number] >=30.5 umol/L   Small LDL Particle Number 664 (H) <=527 nmol/L   LDL Size 20.3 (L) >20.5 nm    Comment:   ----------------------------------------------------------                  ** INTERPRETATIVE INFORMATION**                  PARTICLE CONCENTRATION AND SIZE                     <--Lower CVD Risk   Higher CVD Risk-->   LDL AND HDL PARTICLES   Percentile in Reference Population   HDL-P (total)        High     75th    50th    25th   Low                        >34.9    34.9    30.5    26.7   <26.7   Small LDL-P          Low      25th    50th    75th   High                        <117     117     527     839    >839   LDL Size   <-Large (Pattern A)->    <-Small (Pattern B)->                     23.0    20.6           20.5      19.0  ---------------------------------------------------------- Small LDL-P and LDL Size are associated with CVD risk, but not after LDL-P is taken into account.    LP-IR Score 63 (H) <=45    Comment: INSULIN RESISTANCE MARKER     <--Insulin Sensitive    Insulin Resistant-->            Percentile in Reference Population Insulin Resistance Score LP-IR Score   Low   25th   50th   75th   High               <27   27     45     63     >63 LP-IR Score is inaccurate if patient is non-fasting. The LP-IR score is a laboratory developed index that has been associated with insulin resistance and diabetes risk and should be used as one component of a physician's clinical assessment.    No results found.  VITALS: BP 104/76 (BP Location: Left Arm, Patient Position: Sitting, Cuff Size: Normal)   Pulse (!) 57   Ht 5\' 11"  (1.803 m)   Wt 182 lb (82.6 kg)   BMI 25.38 kg/m   EXAM: General appearance: alert, no distress and thin Neck: no carotid bruit, no JVD and thyroid not enlarged, symmetric, no tenderness/mass/nodules Lungs: clear to auscultation bilaterally Heart: regular rate and rhythm, S1, S2 normal, no murmur, click, rub or gallop Abdomen: soft, non-tender; bowel sounds normal; no masses,  no organomegaly Extremities: extremities normal, atraumatic, no cyanosis or  edema Pulses: 2+ and symmetric Skin: Skin color, texture, turgor normal. No rashes or lesions Neurologic: Mental status: Alert, oriented, thought content appropriate Psych: Pleasant  EKG: Sinus bradycardia 57-personally reviewed  ASSESSMENT: 1. Dyslipidemia 2. Family history of CAD 3. Seasonal allergic rhinitis  PLAN: 1.   Russell Watkins continues to do well is asymptomatic.  He had a well-controlled lipid profile on rosuvastatin and fenofibrate.  He needs to continue to work on diet and more exercise [place] his numbers are slightly higher than they were a year ago.  I would not make any medication changes at this time.  I believe although his family history of heart disease he is at low risk of events.  Follow-up annually or sooner as needed.  Chrystie Nose, MD, [facility], FACP  [place]  [facility]  Medical Director of the Advanced Lipid Disorders &  Cardiovascular Risk Reduction Clinic Diplomate of the American Board of Clinical Lipidology Attending Cardiologist  Direct Dial: [phone]  Fax: [phone]  Website:  [email or web address] 05/07/2018, 8:15 AM

## 2018-06-06 DIAGNOSIS — Z23 Encounter for immunization: Secondary | ICD-10-CM | POA: Diagnosis not present

## 2018-06-06 DIAGNOSIS — G479 Sleep disorder, unspecified: Secondary | ICD-10-CM | POA: Diagnosis not present

## 2018-06-22 DIAGNOSIS — M25512 Pain in left shoulder: Secondary | ICD-10-CM | POA: Diagnosis not present

## 2018-06-23 DIAGNOSIS — M25612 Stiffness of left shoulder, not elsewhere classified: Secondary | ICD-10-CM | POA: Diagnosis not present

## 2018-06-23 DIAGNOSIS — M25512 Pain in left shoulder: Secondary | ICD-10-CM | POA: Diagnosis not present

## 2019-03-01 ENCOUNTER — Other Ambulatory Visit: Payer: Self-pay | Admitting: Internal Medicine

## 2019-03-01 DIAGNOSIS — E785 Hyperlipidemia, unspecified: Secondary | ICD-10-CM

## 2019-03-02 DIAGNOSIS — G4700 Insomnia, unspecified: Secondary | ICD-10-CM | POA: Diagnosis not present

## 2019-05-14 DIAGNOSIS — E785 Hyperlipidemia, unspecified: Secondary | ICD-10-CM | POA: Diagnosis not present

## 2019-05-15 LAB — NMR, LIPOPROFILE
Cholesterol, Total: 157 mg/dL (ref 100–199)
HDL Particle Number: 37.7 umol/L (ref >=30.5)
HDL-C: 44 mg/dL (ref >39)
LDL Particle Number: 1355 nmol/L — ABNORMAL HIGH (ref <1000)
LDL Size: 20.8 nm (ref >20.5)
LDL-C (NIH Calc): 95 mg/dL (ref 0–99)
LP-IR Score: 71 — ABNORMAL HIGH (ref <=45)
Small LDL Particle Number: 549 nmol/L — ABNORMAL HIGH (ref <=527)
Triglycerides: 96 mg/dL (ref 0–149)

## 2019-05-17 ENCOUNTER — Other Ambulatory Visit: Payer: Self-pay | Admitting: Internal Medicine

## 2019-05-20 ENCOUNTER — Other Ambulatory Visit: Payer: Self-pay

## 2019-05-20 ENCOUNTER — Encounter: Payer: Self-pay | Admitting: Internal Medicine

## 2019-05-20 ENCOUNTER — Ambulatory Visit (INDEPENDENT_AMBULATORY_CARE_PROVIDER_SITE_OTHER): Payer: BC Managed Care – PPO | Admitting: Internal Medicine

## 2019-05-20 VITALS — BP 124/72 | HR 66 | Temp 97.9°F | Ht 71.0 in | Wt 185.0 lb

## 2019-05-20 DIAGNOSIS — E785 Hyperlipidemia, unspecified: Secondary | ICD-10-CM

## 2019-05-20 DIAGNOSIS — Z8249 Family history of ischemic heart disease and other diseases of the circulatory system: Secondary | ICD-10-CM | POA: Diagnosis not present

## 2019-05-20 NOTE — Progress Notes (Signed)
OFFICE NOTE  Chief Complaint:  No complaints  Primary Care Physician: Assunta Found, MD  HPI:  Russell Watkins is a pleasant 58 year old gentleman with a history of coronary disease in the family, dyslipidemia and hypertension. However, he had marked weight loss and is no longer hypertensive. He has been on Crestor 10 mg which was changed to from pravastatin after a lipid profile in January 2013 which showed LDL particle number of 1486, an LDL cholesterol of 62, triglycerides 246, and LDL particle number of 877. Overall, fairly good control; however, there are still a higher number of LDL particles. He also was started on fenofibrate 145 for the increased triglycerides and seems to be tolerating the medicines well. Overall, he has no complaints. A repeat lipid profile performed in January 2014 demonstrated an LDL particle number of 1073, LDL-C. of 69, HDL-C. 45 and triglycerides at 117. This demonstrates excellent control.  Otherwise he has done well over the past year denies any chest pain or shortness of breath with exertion.  [name] is doing well overall. Denies a chest pain or shortness of breath. He remains active. Repeat cholesterol testing indicates a slightly higher particle number with LDL-P of 1374, the LDL C is 80, HDL 47 and triglycerides of 103. This is still an overall favorable profile however particle numbers are up. This is primarily due to high the VLDL number. He'll will need to continue to work on diet and exercise. This point is no indication to change his medications.  02/13/2016  Mr. Hartl was seen back in the office today for follow-up. Overall he seems to be doing quite well. He denies any chest pain or worsening shortness of breath. He maintains an active lifestyle. He recently had a lipid profile drawn on 02/09/2016 however those results are still pending. There have been no new occurrences with regards to his health history over the past year.  12/30/2016  Mr.  Szabo returns today for follow-up. Overall seems to be doing very well. He suffering from some seasonal allergies including itchy eyes and runny nose. He recently had repeat lab work looking at his cholesterol profile however that still pending. Otherwise is asymptomatic. Denies any chest pain or worsening shortness of breath with exertion. Weight is been fairly stable although he wants to lose more weight. He intends to start more regular exercise as well as playing more golf.  05/07/2018  [name] is seen today in follow-up.  This is an annual visit and is without complaints.  He recently moved and is working on setting up home gym.  He says that his diet has not been as adequate recently.  We did repeat recent lipid testing including a lipoprotein NMR.  This indicates slightly worse cholesterol numbers however still reasonably good control.  His LDL-P was 1000, 119.  LDL-C was 85, HDL-C 48, triglycerides 61 and he is a small LDL particle number was 664.  He reports compliance with fenofibrate and rosuvastatin.  His diet may not be as healthy as it had been last year.  05/20/2019  [name] returns today for follow-up.  Overall he continues to do well.  Denies any chest pain or worsening shortness of breath.  The lipoprotein NMR which showed an LDL particle number of 1355, LDL-C of 95, HDL of 44 and triglycerides 96.  This does represent a small increase in his lipids however small LDL particle numbers improved from 664-549.  I suspect this is mostly related to diet and decreased  exercise.  He intends to start working on that more.  PMHx:  Past Medical History:  Diagnosis Date  . Dyslipidemia   . Family history of heart disease   . Hypertension   . Insomnia     Past Surgical History:  Procedure Laterality Date  . CARDIAC CATHETERIZATION  05/31/2005   normal LV systolic function, normal coronaries ([name])  . NM MYOCAR PERF WALL MOTION  02/2009   bruce myoview; no evidence of  significant ischemia, normal pattern of perfusion, post-stress EF 70%  . TRANSTHORACIC ECHOCARDIOGRAM  2004   RV mildly dilated, RA mildly dilated, mild TR    FAMHx:  Family History  Problem Relation Age of Onset  . Coronary artery disease Mother        CABG  . Stroke Paternal Grandfather     SOCHx:   reports that he has never smoked. He has never used smokeless tobacco. He reports current alcohol use. He reports that he does not use drugs.  ALLERGIES:  No Known Allergies  ROS: Pertinent items noted in HPI and remainder of comprehensive ROS otherwise negative.  HOME MEDS: Current Outpatient Medications  Medication Sig Dispense Refill  . Cyanocobalamin (VITAMIN B 12 PO) Take 2 tablets by mouth daily.    . fenofibrate (TRICOR) 145 MG tablet TAKE 1 TABLET(145 MG) BY MOUTH AT BEDTIME 90 tablet 3  . Omega-3 Fatty Acids (FISH OIL PO) Take 1-2 capsules by mouth daily.    . rosuvastatin (CRESTOR) 10 MG tablet TAKE 1 TABLET(10 MG) BY MOUTH AT BEDTIME 90 tablet 0  . zolpidem (AMBIEN) 10 MG tablet Take 5 mg by mouth at bedtime as needed for sleep.     No current facility-administered medications for this visit.     LABS/IMAGING: No results found for this or any previous visit (from the past 48 hour(s)). No results found.  VITALS: BP 124/72 (BP Location: Left Arm, Patient Position: Sitting, Cuff Size: Normal)   Pulse 66   Temp 97.9 F (36.6 C)   Ht 5\' 11"  (1.803 m)   Wt 185 lb (83.9 kg)   BMI 25.80 kg/m   EXAM: General appearance: alert, no distress and thin Neck: no carotid bruit, no JVD and thyroid not enlarged, symmetric, no tenderness/mass/nodules Lungs: clear to auscultation bilaterally Heart: regular rate and rhythm, S1, S2 normal, no murmur, click, rub or gallop Abdomen: soft, non-tender; bowel sounds normal; no masses,  no organomegaly Extremities: extremities normal, atraumatic, no cyanosis or edema Pulses: 2+ and symmetric Skin: Skin color, texture, turgor  normal. No rashes or lesions Neurologic: Mental status: Alert, oriented, thought content appropriate Psych: Pleasant  EKG: Normal sinus rhythm 66-personally reviewed  ASSESSMENT: 1. Dyslipidemia 2. Family history of CAD 3. Seasonal allergic rhinitis  PLAN: 1.   Mr. Lipschutz continues to do well and is asymptomatic.  He does have a family history of premature coronary disease.  Although his lipids are fairly well controlled, he will be helpful to further re-stratify him with a coronary calcium score.  We discussed that today and he intends to go through that early next year.  I will contact him with the results of that and just his treatment accordingly.  Follow-up annually or sooner as needed.  Pixie Casino, MD, [facility], [place] Director of the Advanced Lipid Disorders &  Cardiovascular Risk Reduction Clinic Diplomate of the American Board of Clinical Lipidology Attending Cardiologist  Direct Dial: [phone]  Fax: [phone]  Website:  [email or web address] 05/20/2019, 8:11 AM

## 2019-05-20 NOTE — Patient Instructions (Signed)
Medication Instructions:  NO CHANGES If you need a refill on your cardiac medications before your next appointment, please call your pharmacy.   Lab work: FASTING lab work prior to next appointment with Dr. Rennis Golden in 1 year If you have labs (blood work) drawn today and your tests are completely normal, you will receive your results only by: Marland Kitchen MyChart Message (if you have MyChart) OR . A paper copy in the mail If you have any lab test that is abnormal or we need to change your treatment, we will call you to review the results.  Testing/Procedures: Dr. Rennis Golden has ordered a CT coronary calcium score. This test is done at [address] [address]. This is $150 out of pocket.   Coronary CalciumScan A coronary calcium scan is an imaging test used to look for deposits of calcium and other fatty materials (plaques) in the inner lining of the blood vessels of the heart (coronary arteries). These deposits of calcium and plaques can partly clog and narrow the coronary arteries without producing any symptoms or warning signs. This puts a person at risk for a heart attack. This test can detect these deposits before symptoms develop. Tell a health care provider about:  Any allergies you have.  All medicines you are taking, including vitamins, herbs, eye drops, creams, and over-the-counter medicines.  Any problems you or family members have had with anesthetic medicines.  Any blood disorders you have.  Any surgeries you have had.  Any medical conditions you have.  Whether you are pregnant or may be pregnant. What are the risks? Generally, this is a safe procedure. However, problems may occur, including:  Harm to a pregnant woman and her unborn baby. This test involves the use of radiation. Radiation exposure can be dangerous to a pregnant woman and her unborn baby. If you are pregnant, you generally should not have this procedure done.  Slight increase in the risk of cancer. This is  because of the radiation involved in the test. What happens before the procedure? No preparation is needed for this procedure. What happens during the procedure?  You will undress and remove any jewelry around your neck or chest.  You will put on a hospital gown.  Sticky electrodes will be placed on your chest. The electrodes will be connected to an electrocardiogram (ECG) machine to record a tracing of the electrical activity of your heart.  A CT scanner will take pictures of your heart. During this time, you will be asked to lie still and hold your breath for 2-3 seconds while a picture of your heart is being taken. The procedure may vary among health care providers and hospitals. What happens after the procedure?  You can get dressed.  You can return to your normal activities.  It is up to you to get the results of your test. Ask your health care provider, or the department that is doing the test, when your results will be ready. Summary  A coronary calcium scan is an imaging test used to look for deposits of calcium and other fatty materials (plaques) in the inner lining of the blood vessels of the heart (coronary arteries).  Generally, this is a safe procedure. Tell your health care provider if you are pregnant or may be pregnant.  No preparation is needed for this procedure.  A CT scanner will take pictures of your heart.  You can return to your normal activities after the scan is done. This information is not intended  to replace advice given to you by your health care provider. Make sure you discuss any questions you have with your health care provider. Document Released: 02/01/2008 Document Revised: 06/24/2016 Document Reviewed: 06/24/2016 Elsevier Interactive Patient Education  2017 [place]: At [facility], you and your health needs are our priority.  As part of our continuing mission to provide you with exceptional heart care, we have created  designated Provider Care Teams.  These Care Teams include your primary Cardiologist (physician) and Advanced Practice Providers (APPs -  Physician Assistants and Nurse Practitioners) who all work together to provide you with the care you need, when you need it. You will need a follow up appointment in 12 months.  Please call our office 2 months in advance to schedule this appointment.  You may see Dr. Debara Pickett or one of the following Advanced Practice Providers on your designated Care Team: Almyra Deforest, [place] . Fabian Sharp, PA-C  Any Other Special Instructions Will Be Listed Below (If Applicable).

## 2019-06-10 ENCOUNTER — Other Ambulatory Visit: Payer: Self-pay

## 2019-06-10 DIAGNOSIS — Z20822 Contact with and (suspected) exposure to covid-19: Secondary | ICD-10-CM

## 2019-06-17 LAB — NOVEL CORONAVIRUS, NAA: SARS-CoV-2, NAA: NOT DETECTED

## 2019-09-14 ENCOUNTER — Other Ambulatory Visit: Payer: BC Managed Care – PPO

## 2019-09-21 ENCOUNTER — Other Ambulatory Visit: Payer: Self-pay | Admitting: Internal Medicine

## 2019-11-17 ENCOUNTER — Ambulatory Visit (HOSPITAL_COMMUNITY)
Admission: EM | Admit: 2019-11-17 | Discharge: 2019-11-17 | Disposition: A | Discharge status: Home / Self Care | Payer: 59 | Attending: Family Medicine | Admitting: Family Medicine

## 2019-11-17 ENCOUNTER — Other Ambulatory Visit: Payer: Self-pay

## 2019-11-17 ENCOUNTER — Encounter (HOSPITAL_COMMUNITY): Payer: Self-pay

## 2019-11-17 DIAGNOSIS — F5101 Primary insomnia: Secondary | ICD-10-CM | POA: Diagnosis not present

## 2019-11-17 MED ORDER — ZOLPIDEM TARTRATE 10 MG PO TABS: 10.0000 mg | ORAL_TABLET | Freq: Every evening | ORAL | 0 refills | Status: DC | PRN

## 2019-11-17 NOTE — Discharge Instructions (Addendum)
Refilled ambien today.  Follow up with your primary care provider as needed.

## 2019-11-17 NOTE — ED Provider Notes (Signed)
[facility]    CSN: [id number] Arrival date & time: 11/17/19  1453      History   Chief Complaint No chief complaint on file.   HPI Russell Watkins is a 59 y.o. male.   Patient reports for medication refill.  Reports that he needs a refill on his Ambien medication.  Reports that he sees his primary care with Dr. Hilma Watkins in [place] at least once a year for physical and blood work.  Denies any other symptoms, headache, sore throat, shortness of breath, cough, chest tightness, nausea, vomiting, diarrhea, rash, fever, other symptoms.  ROS per HPI  The history is provided by the patient.    Past Medical History:  Diagnosis Date  . Dyslipidemia   . Family history of heart disease   . Hypertension   . Insomnia     Patient Active Problem List   Diagnosis Date Noted  . Seasonal allergic rhinitis 12/30/2016  . Dyslipidemia 09/21/2013  . Family history of premature CAD 09/21/2013    Past Surgical History:  Procedure Laterality Date  . CARDIAC CATHETERIZATION  05/31/2005   normal LV systolic function, normal coronaries (Dr. Jackie Watkins)  . NM MYOCAR PERF WALL MOTION  02/2009   bruce myoview; no evidence of significant ischemia, normal pattern of perfusion, post-stress EF 70%  . TRANSTHORACIC ECHOCARDIOGRAM  2004   RV mildly dilated, RA mildly dilated, mild TR       Home Medications    Prior to Admission medications   Medication Sig Start Date End Date Taking? Authorizing Provider  Cyanocobalamin (VITAMIN B 12 PO) Take 2 tablets by mouth daily.    [provider]  fenofibrate (TRICOR) 145 MG tablet TAKE 1 TABLET(145 MG) BY MOUTH AT BEDTIME 05/17/19   [name], Russell Corwin, MD  Omega-3 Fatty Acids (FISH OIL PO) Take 1-2 capsules by mouth daily.    [provider]  rosuvastatin (CRESTOR) 10 MG tablet TAKE 1 TABLET(10 MG) BY MOUTH AT BEDTIME 09/21/19   [name], Russell Corwin, MD  zolpidem (AMBIEN) 10 MG tablet Take 1 tablet (10 mg total) by mouth at bedtime  as needed for sleep. 11/17/19 02/15/20  Russell Congress, NP    Family History Family History  Problem Relation Age of Onset  . Coronary artery disease Mother        CABG  . Stroke Paternal Grandfather     Social History Social History   Tobacco Use  . Smoking status: Never Smoker  . Smokeless tobacco: Never Used  Substance Use Topics  . Alcohol use: Yes    Comment: on weekends  . Drug use: No     Allergies   Patient has no known allergies.   Review of Systems Review of Systems   Physical Exam Triage Vital Signs ED Triage Vitals  Enc Vitals Group     BP 11/17/19 1506 130/69     Pulse Rate 11/17/19 1506 72     Resp 11/17/19 1506 18     Temp 11/17/19 1506 98.2 F (36.8 C)     Temp Source 11/17/19 1506 Oral     SpO2 11/17/19 1506 97 %     Weight 11/17/19 1508 185 lb (83.9 kg)     Height --      Head Circumference --      Peak Flow --      Pain Score 11/17/19 1507 0     Pain Loc --      Pain Edu? --  Excl. in GC? --    No data found.  Updated Vital Signs BP 130/69 (BP Location: Right Arm)   Pulse 72   Temp 98.2 F (36.8 C) (Oral)   Resp 18   Wt 185 lb (83.9 kg)   SpO2 97%   BMI 25.80 kg/m   Visual Acuity Right Eye Distance:   Left Eye Distance:   Bilateral Distance:    Right Eye Near:   Left Eye Near:    Bilateral Near:     Physical Exam Vitals and nursing note reviewed.  Constitutional:      General: He is not in acute distress.    Appearance: Normal appearance. He is well-developed and normal weight. He is not ill-appearing.  HENT:     Head: Normocephalic and atraumatic.  Eyes:     Extraocular Movements: Extraocular movements intact.     Conjunctiva/sclera: Conjunctivae normal.     Pupils: Pupils are equal, round, and reactive to light.  Cardiovascular:     Rate and Rhythm: Normal rate and regular rhythm.     Heart sounds: Normal heart sounds. No murmur.  Pulmonary:     Effort: Pulmonary effort is normal. No respiratory  distress.     Breath sounds: Normal breath sounds. No stridor. No wheezing, rhonchi or rales.  Chest:     Chest wall: No tenderness.  Abdominal:     General: Abdomen is flat. There is no distension.     Palpations: Abdomen is soft. There is no mass.     Tenderness: There is no abdominal tenderness. There is no guarding or rebound.     Hernia: No hernia is present.  Musculoskeletal:        General: Normal range of motion.     Cervical back: Normal range of motion and neck supple.  Skin:    General: Skin is warm and dry.     Capillary Refill: Capillary refill takes less than 2 seconds.  Neurological:     General: No focal deficit present.     Mental Status: He is alert and oriented to person, place, and time.  Psychiatric:        Mood and Affect: Mood normal.        Behavior: Behavior normal.      UC Treatments / Results  Labs (all labs ordered are listed, but only abnormal results are displayed) Labs Reviewed - No data to display  EKG   Radiology No results found.  Procedures Procedures (including critical care time)  Medications Ordered in UC Medications - No data to display  Initial Impression / Assessment and Plan / UC Course  I have reviewed the triage vital signs and the nursing notes.  Pertinent labs & imaging results that were available during my care of the patient were reviewed by me and considered in my medical decision making (see chart for details).     Presents with primary insomnia.  Reports that he has been taking Ambien for years as needed for sleep and that this helped him tremendously.  Reports that he sees his primary care provider Dr. Phillips Watkins in [place] least once a year.  He says that the co-pay is the same here as it is with his primary care provider so he decided to come here for refills.  We will refill Ambien today, discussed with patient that he can get further refills usually by just calling his primary care provider.  Follow-up with  primary care provider as needed. Final Clinical Impressions(s) / UC  Diagnoses   Final diagnoses:  Primary insomnia     Discharge Instructions     Refilled ambien today.  Follow up with your primary care provider as needed.     ED Prescriptions    Medication Sig Dispense Auth. Provider   zolpidem (AMBIEN) 10 MG tablet Take 1 tablet (10 mg total) by mouth at bedtime as needed for sleep. 90 tablet Moshe Cipro, NP     I have reviewed the PDMP during this encounter.   Moshe Cipro, NP 11/17/19 1553

## 2019-11-17 NOTE — ED Triage Notes (Signed)
Pt states he needs med refill ( Zolpidem 10 mg ).

## 2020-03-22 ENCOUNTER — Telehealth: Payer: Self-pay | Admitting: Internal Medicine

## 2020-03-22 DIAGNOSIS — E785 Hyperlipidemia, unspecified: Secondary | ICD-10-CM

## 2020-03-22 NOTE — Telephone Encounter (Signed)
NMR lipoprofile ordered & mailed w/appointment reminder - due Oct 2021

## 2020-03-29 ENCOUNTER — Telehealth: Payer: Self-pay | Admitting: Internal Medicine

## 2020-03-29 NOTE — Telephone Encounter (Signed)
Patient is requesting to speak with Dr. Blanchie Dessert nurse to discuss whether or not he needs to having testing. He states the testing was previously discussed with Dr. Rennis Golden. However, he does not know what type of testing it was. He would like additional information. Please call.

## 2020-03-29 NOTE — Telephone Encounter (Signed)
Patient called - he would like to arrange coronary calcium score.  Advised to call main # and schedule this.   Explained NMR was ordered and mailed last week.

## 2020-04-21 ENCOUNTER — Encounter (HOSPITAL_COMMUNITY): Payer: Self-pay | Admitting: Emergency Medicine

## 2020-04-21 ENCOUNTER — Other Ambulatory Visit: Payer: 59

## 2020-04-21 ENCOUNTER — Ambulatory Visit (HOSPITAL_COMMUNITY)
Admission: EM | Admit: 2020-04-21 | Discharge: 2020-04-21 | Disposition: A | Discharge status: Home / Self Care | Payer: 59 | Attending: Internal Medicine | Admitting: Internal Medicine

## 2020-04-21 ENCOUNTER — Other Ambulatory Visit: Payer: Self-pay | Admitting: *Deleted

## 2020-04-21 ENCOUNTER — Other Ambulatory Visit: Payer: Self-pay

## 2020-04-21 DIAGNOSIS — Z20822 Contact with and (suspected) exposure to covid-19: Secondary | ICD-10-CM

## 2020-04-21 DIAGNOSIS — F5101 Primary insomnia: Secondary | ICD-10-CM

## 2020-04-21 DIAGNOSIS — Z760 Encounter for issue of repeat prescription: Secondary | ICD-10-CM | POA: Diagnosis not present

## 2020-04-21 MED ORDER — ZOLPIDEM TARTRATE 10 MG PO TABS: 10.0000 mg | ORAL_TABLET | Freq: Every evening | ORAL | 0 refills | Status: DC | PRN

## 2020-04-21 NOTE — ED Provider Notes (Signed)
[facility]    CSN: [id number] Arrival date & time: 04/21/20  4196      History   Chief Complaint Chief Complaint  Patient presents with  . Medication Refill    HPI Russell Watkins is a 59 y.o. male presenting today for medication refill. He is needing a refill of his Ambien. Uses Ambien for insomnia. Has primary care in [place] who he sees for physicals, but urgent care is more convenient when he needs refills. Travels a lot for work, uses Ambien as needed. Reports relief with medicine.   HPI  Past Medical History:  Diagnosis Date  . Dyslipidemia   . Family history of heart disease   . Hypertension   . Insomnia     Patient Active Problem List   Diagnosis Date Noted  . Seasonal allergic rhinitis 12/30/2016  . Dyslipidemia 09/21/2013  . Family history of premature CAD 09/21/2013    Past Surgical History:  Procedure Laterality Date  . CARDIAC CATHETERIZATION  05/31/2005   normal LV systolic function, normal coronaries (Dr. Evlyn Courier)  . NM MYOCAR PERF WALL MOTION  02/2009   bruce myoview; no evidence of significant ischemia, normal pattern of perfusion, post-stress EF 70%  . TRANSTHORACIC ECHOCARDIOGRAM  2004   RV mildly dilated, RA mildly dilated, mild TR       Home Medications    Prior to Admission medications   Medication Sig Start Date End Date Taking? Authorizing Provider  Cyanocobalamin (VITAMIN B 12 PO) Take 2 tablets by mouth daily.    [provider]  fenofibrate (TRICOR) 145 MG tablet TAKE 1 TABLET(145 MG) BY MOUTH AT BEDTIME 05/17/19   [name], Lisette Abu, MD  Omega-3 Fatty Acids (FISH OIL PO) Take 1-2 capsules by mouth daily.    [provider]  rosuvastatin (CRESTOR) 10 MG tablet TAKE 1 TABLET(10 MG) BY MOUTH AT BEDTIME 09/21/19   [name], Lisette Abu, MD  zolpidem (AMBIEN) 10 MG tablet Take 1 tablet (10 mg total) by mouth at bedtime as needed for sleep. 04/21/20 07/20/20  Shawnte Winton, Junius Creamer, PA-C    Family History Family History   Problem Relation Age of Onset  . Coronary artery disease Mother        CABG  . Stroke Paternal Grandfather     Social History Social History   Tobacco Use  . Smoking status: Never Smoker  . Smokeless tobacco: Never Used  Substance Use Topics  . Alcohol use: Yes    Comment: on weekends  . Drug use: No     Allergies   Patient has no known allergies.   Review of Systems Review of Systems  Constitutional: Negative for fatigue and fever.  HENT: Negative for congestion, sinus pressure and sore throat.   Eyes: Negative for photophobia, pain and visual disturbance.  Respiratory: Negative for cough and shortness of breath.   Cardiovascular: Negative for chest pain.  Gastrointestinal: Negative for abdominal pain, nausea and vomiting.  Genitourinary: Negative for decreased urine volume and hematuria.  Musculoskeletal: Negative for myalgias, neck pain and neck stiffness.  Neurological: Negative for dizziness, syncope, facial asymmetry, speech difficulty, weakness, light-headedness, numbness and headaches.  Psychiatric/Behavioral: Positive for sleep disturbance.     Physical Exam Triage Vital Signs ED Triage Vitals  Enc Vitals Group     BP      Pulse      Resp      Temp      Temp src      SpO2  Weight      Height      Head Circumference      Peak Flow      Pain Score      Pain Loc      Pain Edu?      Excl. in GC?    No data found.  Updated Vital Signs BP 117/81 (BP Location: Right Arm)   Pulse 65   Temp 98 F (36.7 C) (Oral)   Resp 18   SpO2 96%   Visual Acuity Right Eye Distance:   Left Eye Distance:   Bilateral Distance:    Right Eye Near:   Left Eye Near:    Bilateral Near:     Physical Exam Vitals and nursing note reviewed.  Constitutional:      Appearance: He is well-developed.     Comments: No acute distress  HENT:     Head: Normocephalic and atraumatic.     Nose: Nose normal.  Eyes:     Conjunctiva/sclera: Conjunctivae normal.    Cardiovascular:     Rate and Rhythm: Normal rate.  Pulmonary:     Effort: Pulmonary effort is normal. No respiratory distress.  Abdominal:     General: There is no distension.  Musculoskeletal:        General: Normal range of motion.     Cervical back: Neck supple.  Skin:    General: Skin is warm and dry.  Neurological:     Mental Status: He is alert and oriented to person, place, and time.      UC Treatments / Results  Labs (all labs ordered are listed, but only abnormal results are displayed) Labs Reviewed - No data to display  EKG   Radiology No results found.  Procedures Procedures (including critical care time)  Medications Ordered in UC Medications - No data to display  Initial Impression / Assessment and Plan / UC Course  I have reviewed the triage vital signs and the nursing notes.  Pertinent labs & imaging results that were available during my care of the patient were reviewed by me and considered in my medical decision making (see chart for details).     Ambien refilled. Continue to use as needed. Follow up with PCP for regular preventative care/screening.  Discussed strict return precautions. Patient verbalized understanding and is agreeable with plan.  Final Clinical Impressions(s) / UC Diagnoses   Final diagnoses:  Primary insomnia  Medication refill     Discharge Instructions     Ambien refilled Continue to use as needed Follow up for any concerns    ED Prescriptions    Medication Sig Dispense Auth. Provider   zolpidem (AMBIEN) 10 MG tablet  (Status: Discontinued) Take 1 tablet (10 mg total) by mouth at bedtime as needed for sleep. 90 tablet Kairo Laubacher C, PA-C   zolpidem (AMBIEN) 10 MG tablet Take 1 tablet (10 mg total) by mouth at bedtime as needed for sleep. 90 tablet Fancy Dunkley, [place] C, PA-C     I have reviewed the PDMP during this encounter.   Gaither Biehn, [place] C, PA-C 04/21/20 1100

## 2020-04-21 NOTE — ED Triage Notes (Signed)
Patient presents to [facility] for a refill on Zolpidem.  Patient states coming to the UC is the same copay than seeing his PCP who he says prescribes it, so he decides to come her as he chooses.

## 2020-04-21 NOTE — Discharge Instructions (Signed)
Ambien refilled Continue to use as needed Follow up for any concerns

## 2020-04-22 LAB — NOVEL CORONAVIRUS, NAA: SARS-CoV-2, NAA: NOT DETECTED

## 2020-07-01 ENCOUNTER — Ambulatory Visit: Payer: 59 | Attending: Internal Medicine

## 2020-07-01 ENCOUNTER — Ambulatory Visit: Payer: 59

## 2020-07-01 DIAGNOSIS — Z23 Encounter for immunization: Secondary | ICD-10-CM

## 2020-07-01 NOTE — Progress Notes (Signed)
   Covid-19 Vaccination Clinic  Name:  PIO EATHERLY    MRN: [id number] DOB: [birth year]  07/01/2020  Mr. Ciccarelli was observed post Covid-19 immunization for 15 minutes without incident. He was provided with Vaccine Information Sheet and instruction to access the V-Safe system.   Mr. Raybourn was instructed to call 911 with any severe reactions post vaccine: Marland Kitchen Difficulty breathing  . Swelling of face and throat  . A fast heartbeat  . A bad rash all over body  . Dizziness and weakness   Immunizations Administered    Name Date Dose VIS Date Route   Pfizer COVID-19 Vaccine 07/01/2020 11:21 AM 0.3 mL 06/07/2020 Intramuscular   Manufacturer: [employer], [employer]   Lot: [id number]   NDC: [address]-0

## 2020-07-11 ENCOUNTER — Ambulatory Visit: Payer: 59 | Admitting: Internal Medicine

## 2020-07-18 ENCOUNTER — Inpatient Hospital Stay: Admission: RE | Admit: 2020-07-18 | Payer: 59 | Source: Ambulatory Visit

## 2020-07-25 LAB — NMR, LIPOPROFILE
Cholesterol, Total: 130 mg/dL (ref 100–199)
HDL Particle Number: 35.8 umol/L (ref >=30.5)
HDL-C: 46 mg/dL (ref >39)
LDL Particle Number: 909 nmol/L (ref <1000)
LDL Size: 20.2 nm — ABNORMAL LOW (ref >20.5)
LDL-C (NIH Calc): 69 mg/dL (ref 0–99)
LP-IR Score: 40 (ref <=45)
Small LDL Particle Number: 469 nmol/L (ref <=527)
Triglycerides: 76 mg/dL (ref 0–149)

## 2020-07-27 ENCOUNTER — Ambulatory Visit (INDEPENDENT_AMBULATORY_CARE_PROVIDER_SITE_OTHER): Payer: 59 | Admitting: Internal Medicine

## 2020-07-27 ENCOUNTER — Encounter: Payer: Self-pay | Admitting: Internal Medicine

## 2020-07-27 ENCOUNTER — Other Ambulatory Visit: Payer: Self-pay

## 2020-07-27 VITALS — BP 114/70 | HR 65 | Ht 71.0 in | Wt 181.6 lb

## 2020-07-27 DIAGNOSIS — Z8249 Family history of ischemic heart disease and other diseases of the circulatory system: Secondary | ICD-10-CM

## 2020-07-27 DIAGNOSIS — E785 Hyperlipidemia, unspecified: Secondary | ICD-10-CM

## 2020-07-27 NOTE — Patient Instructions (Addendum)
Medication Instructions:  Your physician recommends that you continue on your current medications as directed. Please refer to the Current Medication list given to you today.  *If you need a refill on your cardiac medications before your next appointment, please call your pharmacy*   Lab Work: If you need to have lab work done, please contact our office --- Dr. Rennis Golden would order: CMET, CBC, TSH, A1c, PSA  If you have labs (blood work) drawn today and your tests are completely normal, you will receive your results only by: Marland Kitchen MyChart Message (if you have MyChart) OR . A paper copy in the mail If you have any lab test that is abnormal or we need to change your treatment, we will call you to review the results.   Testing/Procedures: Calcium Score Test as planned   Follow-Up: At [facility], you and your health needs are our priority.  As part of our continuing mission to provide you with exceptional heart care, we have created designated Provider Care Teams.  These Care Teams include your primary Cardiologist (physician) and Advanced Practice Providers (APPs -  Physician Assistants and Nurse Practitioners) who all work together to provide you with the care you need, when you need it.  We recommend signing up for the patient portal called "MyChart".  Sign up information is provided on this After Visit Summary.  MyChart is used to connect with patients for Virtual Visits (Telemedicine).  Patients are able to view lab/test results, encounter notes, upcoming appointments, etc.  Non-urgent messages can be sent to your provider as well.   To learn more about what you can do with MyChart, go to [email or web address].    Your next appointment:   12 month(s)  The format for your next appointment:   In Person  Provider:   You may see Chrystie Nose, MD or one of the following Advanced Practice Providers on your designated Care Team:    Azalee Course, PA-C  Micah Flesher, PA-C or   Judy Pimple, PA-C    Other Psychologist, sport and exercise [facility] [address] [place], [place] [address] [address] [place]: [phone] Asencion Partridge, MD Tana Conch, MD Jacquiline Doe, MD Orland Mustard, MD Jarold Motto, [place]  [facility] [address] [place], [place] [address] [address] Line: [phone] Cheryll Cockayne, MD Hillard Danker, MD Oliver Barre, MD Sanda Linger, MD Jacinta Shoe, MD

## 2020-07-27 NOTE — Progress Notes (Signed)
OFFICE NOTE  Chief Complaint:  No complaints  Primary Care Physician: Assunta Found, MD  HPI:  Russell Watkins is a pleasant 59 year old gentleman with a history of coronary disease in the family, dyslipidemia and hypertension. However, he had marked weight loss and is no longer hypertensive. He has been on Crestor 10 mg which was changed to from pravastatin after a lipid profile in January 2013 which showed LDL particle number of 1486, an LDL cholesterol of 62, triglycerides 246, and LDL particle number of 877. Overall, fairly good control; however, there are still a higher number of LDL particles. He also was started on fenofibrate 145 for the increased triglycerides and seems to be tolerating the medicines well. Overall, he has no complaints. A repeat lipid profile performed in January 2014 demonstrated an LDL particle number of 1073, LDL-C. of 69, HDL-C. 45 and triglycerides at 117. This demonstrates excellent control.  Otherwise he has done well over the past year denies any chest pain or shortness of breath with exertion.  Russell Watkins is doing well overall. Denies a chest pain or shortness of breath. He remains active. Repeat cholesterol testing indicates a slightly higher particle number with LDL-P of 1374, the LDL C is 80, HDL 47 and triglycerides of 103. This is still an overall favorable profile however particle numbers are up. This is primarily due to high the VLDL number. He'll will need to continue to work on diet and exercise. This point is no indication to change his medications.  02/13/2016  Russell Watkins was seen back in the office today for follow-up. Overall he seems to be doing quite well. He denies any chest pain or worsening shortness of breath. He maintains an active lifestyle. He recently had a lipid profile drawn on 02/09/2016 however those results are still pending. There have been no new occurrences with regards to his health history over the past year.  12/30/2016  Russell Watkins returns today for follow-up. Overall seems to be doing very well. He suffering from some seasonal allergies including itchy eyes and runny nose. He recently had repeat lab work looking at his cholesterol profile however that still pending. Otherwise is asymptomatic. Denies any chest pain or worsening shortness of breath with exertion. Weight is been fairly stable although he wants to lose more weight. He intends to start more regular exercise as well as playing more golf.  05/07/2018  Russell Watkins is seen today in follow-up.  This is an annual visit and is without complaints.  He recently moved and is working on setting up home gym.  He says that his diet has not been as adequate recently.  We did repeat recent lipid testing including a lipoprotein NMR.  This indicates slightly worse cholesterol numbers however still reasonably good control.  His LDL-P was 1000, 119.  LDL-C was 85, HDL-C 48, triglycerides 61 and he is a small LDL particle number was 664.  He reports compliance with fenofibrate and rosuvastatin.  His diet may not be as healthy as it had been last year.  05/20/2019  Russell Watkins returns today for follow-up.  Overall he continues to do well.  Denies any chest pain or worsening shortness of breath.  The lipoprotein NMR which showed an LDL particle number of 1355, LDL-C of 95, HDL of 44 and triglycerides 96.  This does represent a small increase in his lipids however small LDL particle numbers improved from 664-549.  I suspect this is mostly related to diet and decreased  exercise.  He intends to start working on that more.  07/27/2020  Russell Watkins is seen today in follow-up.  He has made some further dietary changes.  His lipids have improved significantly.  His LDL-P was 1355, now down to 909.  LDL-C is 69, HDL-C is 46 and triglycerides of 76.  Overall he feels well.  He has no chest pain or worsening shortness of breath.  I previously ordered a calcium score which he had forgotten about but  did schedule and will be having an on December 13.  He may be interested in getting some lab since he is had difficulty getting in with his primary care provider.  He apparently lives here in [place].  PMHx:  Past Medical History:  Diagnosis Date  . Dyslipidemia   . Family history of heart disease   . Hypertension   . Insomnia     Past Surgical History:  Procedure Laterality Date  . CARDIAC CATHETERIZATION  05/31/2005   normal LV systolic function, normal coronaries (Dr. Evlyn Courier)  . NM MYOCAR PERF WALL MOTION  02/2009   bruce myoview; no evidence of significant ischemia, normal pattern of perfusion, post-stress EF 70%  . TRANSTHORACIC ECHOCARDIOGRAM  2004   RV mildly dilated, RA mildly dilated, mild TR    FAMHx:  Family History  Problem Relation Age of Onset  . Coronary artery disease Mother        CABG  . Stroke Paternal Grandfather     SOCHx:   reports that he has never smoked. He has never used smokeless tobacco. He reports current alcohol use. He reports that he does not use drugs.  ALLERGIES:  No Known Allergies  ROS: Pertinent items noted in HPI and remainder of comprehensive ROS otherwise negative.  HOME MEDS: Current Outpatient Medications  Medication Sig Dispense Refill  . fenofibrate (TRICOR) 145 MG tablet TAKE 1 TABLET(145 MG) BY MOUTH AT BEDTIME 90 tablet 3  . rosuvastatin (CRESTOR) 10 MG tablet TAKE 1 TABLET(10 MG) BY MOUTH AT BEDTIME 90 tablet 2  . zolpidem (AMBIEN) 10 MG tablet Take 1 tablet (10 mg total) by mouth at bedtime as needed for sleep. 90 tablet 0   No current facility-administered medications for this visit.    LABS/IMAGING: No results found for this or any previous visit (from the past 48 hour(s)). No results found.  VITALS: BP 114/70   Pulse 65   Ht 5\' 11"  (1.803 m)   Wt 181 lb 9.6 oz (82.4 kg)   BMI 25.33 kg/m   EXAM: General appearance: alert, no distress and thin Neck: no carotid bruit, no JVD and thyroid not  enlarged, symmetric, no tenderness/mass/nodules Lungs: clear to auscultation bilaterally Heart: regular rate and rhythm, S1, S2 normal, no murmur, click, rub or gallop Abdomen: soft, non-tender; bowel sounds normal; no masses,  no organomegaly Extremities: extremities normal, atraumatic, no cyanosis or edema Pulses: 2+ and symmetric Skin: Skin color, texture, turgor normal. No rashes or lesions Neurologic: Mental status: Alert, oriented, thought content appropriate Psych: Pleasant  EKG: Normal sinus rhythm at 65-personally reviewed  ASSESSMENT: 1. Dyslipidemia 2. Family history of CAD 3. Seasonal allergic rhinitis  PLAN: 1.   Russell Watkins has had significant improvement in his dyslipidemia on medication but more recently attributed to dietary changes.  Weight is appropriate.  Blood pressures well controlled.  He may wish to have some screening laboratory work done.  He will contact Rodena Medin if he cannot get that ordered in a timely fashion  from his PCP.  Although he is a family history of coronary disease I think he is at low risk of events.  I did recommend a calcium score.  He had stress testing a number of years ago which was negative.  We will contact him with results of his calcium score but he appears to be at goal with regards to his lipids.  Follow-up with me annually or sooner as necessary.  Chrystie Nose, MD, [facility], FACP  [place]  [facility]  Medical Director of the [facility] &  Cardiovascular Risk Reduction Clinic Diplomate of the American Board of Clinical Lipidology Attending Cardiologist  Direct Dial: [phone]  Fax: [phone]  Website:  [email or web address] Nicely Maricia Scotti 07/27/2020, 8:26 AM

## 2020-07-31 ENCOUNTER — Other Ambulatory Visit: Payer: Self-pay

## 2020-07-31 ENCOUNTER — Ambulatory Visit (INDEPENDENT_AMBULATORY_CARE_PROVIDER_SITE_OTHER)
Admission: RE | Admit: 2020-07-31 | Discharge: 2020-07-31 | Disposition: A | Discharge status: Home / Self Care | Payer: Self-pay | Source: Ambulatory Visit | Attending: Internal Medicine | Admitting: Internal Medicine

## 2020-07-31 DIAGNOSIS — Z1329 Encounter for screening for other suspected endocrine disorder: Secondary | ICD-10-CM

## 2020-07-31 DIAGNOSIS — E785 Hyperlipidemia, unspecified: Secondary | ICD-10-CM

## 2020-07-31 DIAGNOSIS — Z125 Encounter for screening for malignant neoplasm of prostate: Secondary | ICD-10-CM

## 2020-07-31 DIAGNOSIS — Z131 Encounter for screening for diabetes mellitus: Secondary | ICD-10-CM

## 2020-07-31 DIAGNOSIS — Z79899 Other long term (current) drug therapy: Secondary | ICD-10-CM

## 2020-07-31 DIAGNOSIS — Z8249 Family history of ischemic heart disease and other diseases of the circulatory system: Secondary | ICD-10-CM

## 2020-08-07 ENCOUNTER — Other Ambulatory Visit: Payer: Self-pay | Admitting: Internal Medicine

## 2020-08-08 ENCOUNTER — Other Ambulatory Visit: Payer: Self-pay

## 2020-08-08 ENCOUNTER — Ambulatory Visit
Admission: RE | Admit: 2020-08-08 | Discharge: 2020-08-08 | Disposition: A | Discharge status: Home / Self Care | Payer: 59 | Source: Ambulatory Visit | Attending: Emergency Medicine | Admitting: Emergency Medicine

## 2020-08-08 VITALS — BP 124/86 | HR 77 | Temp 97.8°F | Resp 18

## 2020-08-08 DIAGNOSIS — G4700 Insomnia, unspecified: Secondary | ICD-10-CM

## 2020-08-08 MED ORDER — ZOLPIDEM TARTRATE 10 MG PO TABS: 10.0000 mg | ORAL_TABLET | Freq: Every evening | ORAL | 0 refills | Status: AC | PRN

## 2020-08-08 NOTE — ED Provider Notes (Signed)
[facility]    CSN: [id number] Arrival date & time: 08/08/20  1301      History   Chief Complaint Chief Complaint  Patient presents with  . Medication Refill  . appt 1    HPI Russell Watkins is a 59 y.o. male presenting today for medication refill.  Reports needing refill of Ambien 10 mg.  Unable to get in with primary care.  Reports primary care as his only in office every other week and he does a lot of traveling for his job making it difficult to schedule follow-up with primary care.  Denies any problems with medicine  HPI  Past Medical History:  Diagnosis Date  . Dyslipidemia   . Family history of heart disease   . Hypertension   . Insomnia     Patient Active Problem List   Diagnosis Date Noted  . Seasonal allergic rhinitis 12/30/2016  . Dyslipidemia 09/21/2013  . Family history of premature CAD 09/21/2013    Past Surgical History:  Procedure Laterality Date  . CARDIAC CATHETERIZATION  05/31/2005   normal LV systolic function, normal coronaries (Dr. Evlyn Courier)  . NM MYOCAR PERF WALL MOTION  02/2009   bruce myoview; no evidence of significant ischemia, normal pattern of perfusion, post-stress EF 70%  . TRANSTHORACIC ECHOCARDIOGRAM  2004   RV mildly dilated, RA mildly dilated, mild TR       Home Medications    Prior to Admission medications   Medication Sig Start Date End Date Taking? Authorizing Provider  fenofibrate (TRICOR) 145 MG tablet TAKE 1 TABLET(145 MG) BY MOUTH AT BEDTIME 08/08/20   [name], Lisette Abu, MD  rosuvastatin (CRESTOR) 10 MG tablet TAKE 1 TABLET(10 MG) BY MOUTH AT BEDTIME 08/08/20   [name], Lisette Abu, MD  zolpidem (AMBIEN) 10 MG tablet Take 1 tablet (10 mg total) by mouth at bedtime as needed for sleep. 08/08/20 11/06/20  Lew Dawes, PA-C    Family History Family History  Problem Relation Age of Onset  . Coronary artery disease Mother        CABG  . Stroke Paternal Grandfather     Social History Social History    Tobacco Use  . Smoking status: Never Smoker  . Smokeless tobacco: Never Used  Substance Use Topics  . Alcohol use: Yes    Comment: on weekends  . Drug use: No     Allergies   Patient has no known allergies.   Review of Systems Review of Systems  Constitutional: Negative for activity change, appetite change, chills, fatigue and fever.  HENT: Negative for congestion, ear pain, rhinorrhea, sinus pressure, sore throat and trouble swallowing.   Eyes: Negative for discharge and redness.  Respiratory: Negative for cough, chest tightness and shortness of breath.   Cardiovascular: Negative for chest pain.  Gastrointestinal: Negative for abdominal pain, diarrhea, nausea and vomiting.  Musculoskeletal: Negative for myalgias.  Skin: Negative for rash.  Neurological: Negative for dizziness, light-headedness and headaches.  Psychiatric/Behavioral: Positive for sleep disturbance.     Physical Exam Triage Vital Signs ED Triage Vitals  Enc Vitals Group     BP 08/08/20 1312 124/86     Pulse Rate 08/08/20 1312 77     Resp 08/08/20 1312 18     Temp 08/08/20 1312 97.8 F (36.6 C)     Temp Source 08/08/20 1312 Oral     SpO2 08/08/20 1312 98 %     Weight --      Height --  Head Circumference --      Peak Flow --      Pain Score 08/08/20 1313 0     Pain Loc --      Pain Edu? --      Excl. in GC? --    No data found.  Updated Vital Signs BP 124/86 (BP Location: Left Arm)   Pulse 77   Temp 97.8 F (36.6 C) (Oral)   Resp 18   SpO2 98%   Visual Acuity Right Eye Distance:   Left Eye Distance:   Bilateral Distance:    Right Eye Near:   Left Eye Near:    Bilateral Near:     Physical Exam Vitals and nursing note reviewed.  Constitutional:      Appearance: He is well-developed and well-nourished.     Comments: No acute distress  HENT:     Head: Normocephalic and atraumatic.     Nose: Nose normal.  Eyes:     Conjunctiva/sclera: Conjunctivae normal.  Cardiovascular:      Rate and Rhythm: Normal rate.  Pulmonary:     Effort: Pulmonary effort is normal. No respiratory distress.  Abdominal:     General: There is no distension.  Musculoskeletal:        General: Normal range of motion.     Cervical back: Neck supple.  Skin:    General: Skin is warm and dry.  Neurological:     Mental Status: He is alert and oriented to person, place, and time.  Psychiatric:        Mood and Affect: Mood and affect normal.      UC Treatments / Results  Labs (all labs ordered are listed, but only abnormal results are displayed) Labs Reviewed - No data to display  EKG   Radiology No results found.  Procedures Procedures (including critical care time)  Medications Ordered in UC Medications - No data to display  Initial Impression / Assessment and Plan / UC Course  I have reviewed the triage vital signs and the nursing notes.  Pertinent labs & imaging results that were available during my care of the patient were reviewed by me and considered in my medical decision making (see chart for details).     Refilled Ambien, discussed with patient again importance of following up with primary care for further refills.  Discussed strict return precautions. Patient verbalized understanding and is agreeable with plan.  Final Clinical Impressions(s) / UC Diagnoses   Final diagnoses:  Insomnia, unspecified type     Discharge Instructions     Ambien refilled Please try to get set up with new primary care for further refills    ED Prescriptions    Medication Sig Dispense Auth. Provider   zolpidem (AMBIEN) 10 MG tablet Take 1 tablet (10 mg total) by mouth at bedtime as needed for sleep. 90 tablet Pattricia Weiher, Gardner C, PA-C     PDMP not reviewed this encounter.   Lew Dawes, [place] 08/08/20 1358

## 2020-08-08 NOTE — Discharge Instructions (Addendum)
Ambien refilled Please try to get set up with new primary care for further refills

## 2020-08-08 NOTE — ED Triage Notes (Signed)
Pt requesting a refill on his Zolpidem 10mg . States his PCP is off this week.

## 2020-08-11 LAB — CBC
Hematocrit: 47.2 % (ref 37.5–51.0)
Hemoglobin: 15.9 g/dL (ref 13.0–17.7)
MCH: 29.9 pg (ref 26.6–33.0)
MCHC: 33.7 g/dL (ref 31.5–35.7)
MCV: 89 fL (ref 79–97)
Platelets: 268 10*3/uL (ref 150–450)
RBC: 5.32 x10E6/uL (ref 4.14–5.80)
RDW: 13.2 % (ref 11.6–15.4)
WBC: 5.7 10*3/uL (ref 3.4–10.8)

## 2020-08-11 LAB — COMPREHENSIVE METABOLIC PANEL
ALT: 25 IU/L (ref 0–44)
AST: 26 IU/L (ref 0–40)
Albumin/Globulin Ratio: 2 (ref 1.2–2.2)
Albumin: 4.7 g/dL (ref 3.8–4.9)
Alkaline Phosphatase: 40 IU/L — ABNORMAL LOW (ref 44–121)
BUN/Creatinine Ratio: 14 (ref 9–20)
BUN: 20 mg/dL (ref 6–24)
Bilirubin Total: 0.5 mg/dL (ref 0.0–1.2)
CO2: 22 mmol/L (ref 20–29)
Calcium: 9.8 mg/dL (ref 8.7–10.2)
Chloride: 106 mmol/L (ref 96–106)
Creatinine, Ser: 1.39 mg/dL — ABNORMAL HIGH (ref 0.76–1.27)
GFR calc Af Amer: 65 mL/min/{1.73_m2} (ref >59)
GFR calc non Af Amer: 56 mL/min/{1.73_m2} — ABNORMAL LOW (ref >59)
Globulin, Total: 2.3 g/dL (ref 1.5–4.5)
Glucose: 111 mg/dL — ABNORMAL HIGH (ref 65–99)
Potassium: 4.7 mmol/L (ref 3.5–5.2)
Sodium: 143 mmol/L (ref 134–144)
Total Protein: 7 g/dL (ref 6.0–8.5)

## 2020-08-11 LAB — PSA: Prostate Specific Ag, Serum: 2.2 ng/mL (ref 0.0–4.0)

## 2020-08-11 LAB — HEMOGLOBIN A1C
Est. average glucose Bld gHb Est-mCnc: 120 mg/dL
Hgb A1c MFr Bld: 5.8 % — ABNORMAL HIGH (ref 4.8–5.6)

## 2020-08-11 LAB — TSH: TSH: 1.13 u[IU]/mL (ref 0.450–4.500)

## 2021-03-29 IMAGING — CT CT CARDIAC CORONARY ARTERY CALCIUM SCORE
3 series · 14 of 20 positions shown, 15 images · non-contrast
Comparison: None.
COMPARISON: None.

Addendum:
EXAM:
OVER-READ INTERPRETATION  CT CHEST

The following report is an over-read performed by radiologist Dr.
Cristian Doty [REDACTED] on 07/31/2020. This
over-read does not include interpretation of cardiac or coronary
anatomy or pathology. The coronary calcium score interpretation by
the cardiologist is attached.
CLINICAL DATA: Risk stratification
Coronary Calcium Score
TECHNIQUE: The patient was scanned on a Siemens Force scanner. Axial
non-contrast 3 mm slices were carried out through the heart. The
data set was analyzed on a dedicated work station and scored using
the Agatson method.

[Series 2: casc 3.0 bv41 2 bestdiast 71 % · axial · 0.36mm/px · z∈[-222,-141]mm · 4 of 46 slices shown, 5 images]
[im 10/46  vessel]
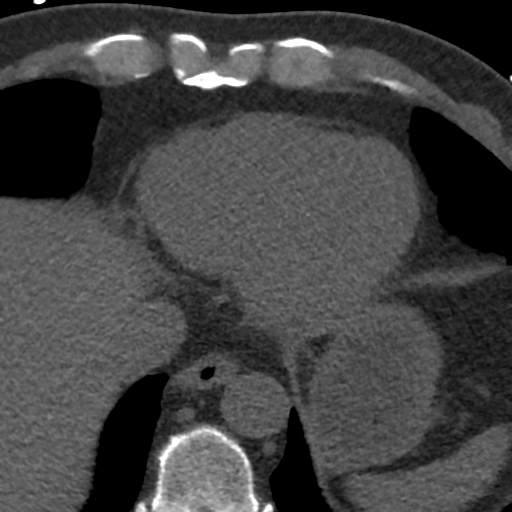
[im 10/46  lung]
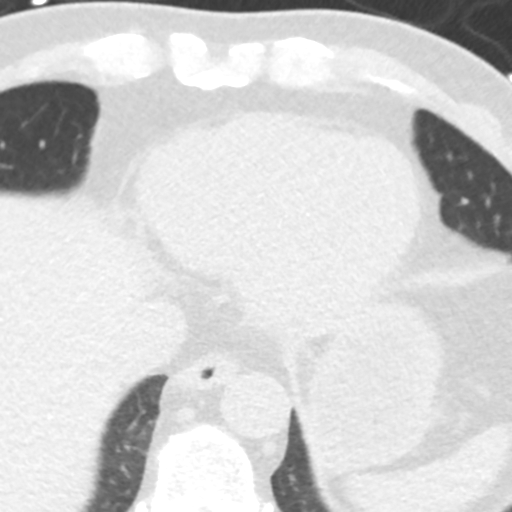
[im 19/46  vessel]
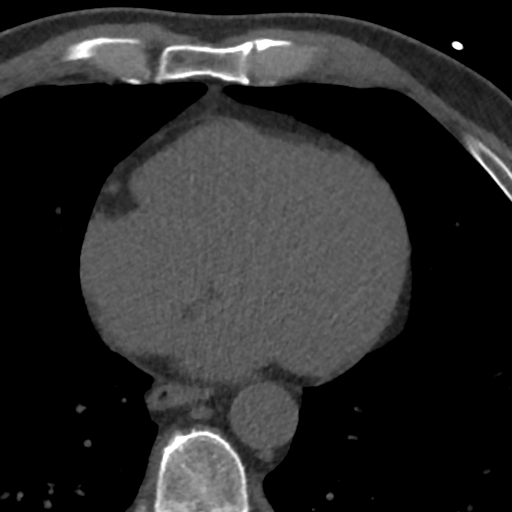
[im 28/46  vessel]
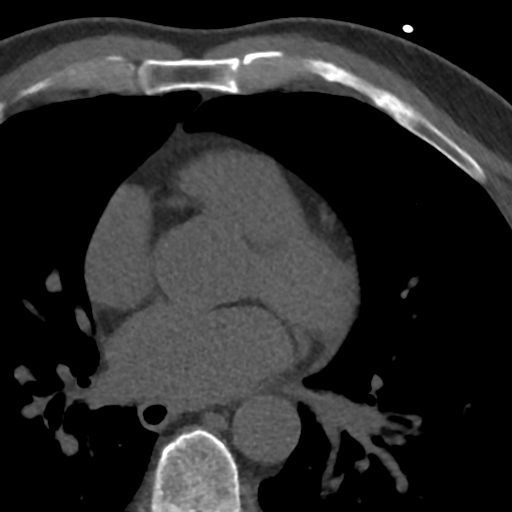
[im 37/46  vessel]
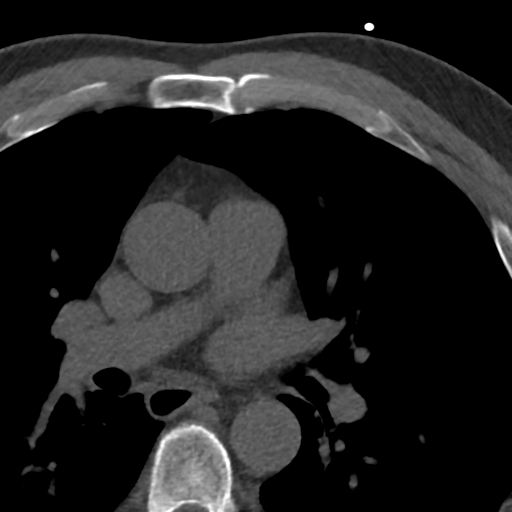

[Series 3: lung 71 % · axial · 0.68mm/px · z∈[-228,-138]mm · 5 of 46 slices shown]
[im 8/46  lung]
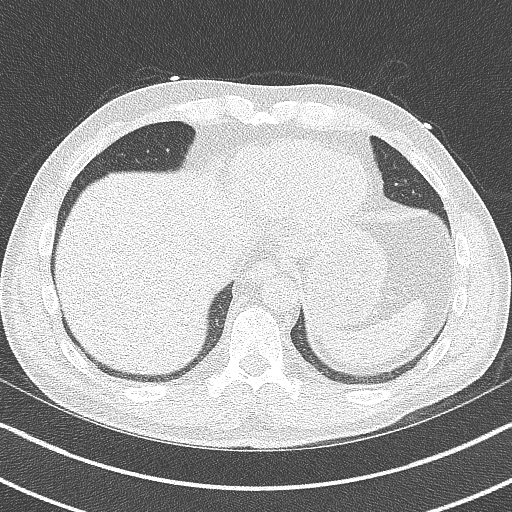
[im 16/46  lung]
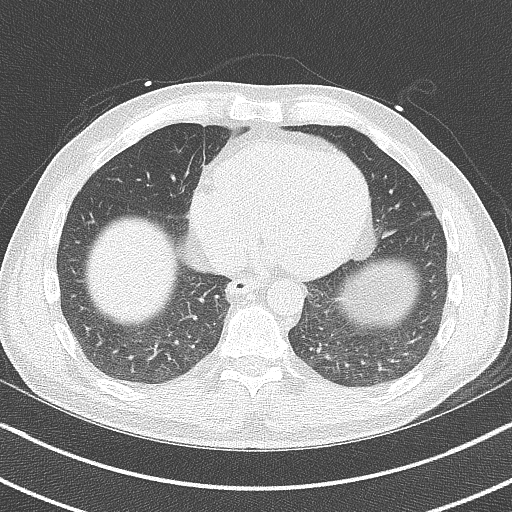
[im 23/46  lung]
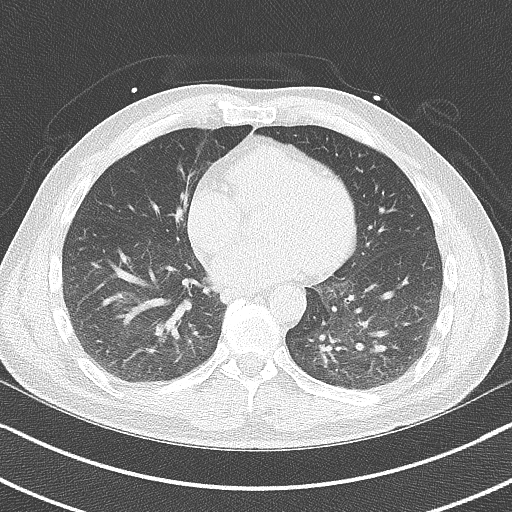
[im 31/46  lung]
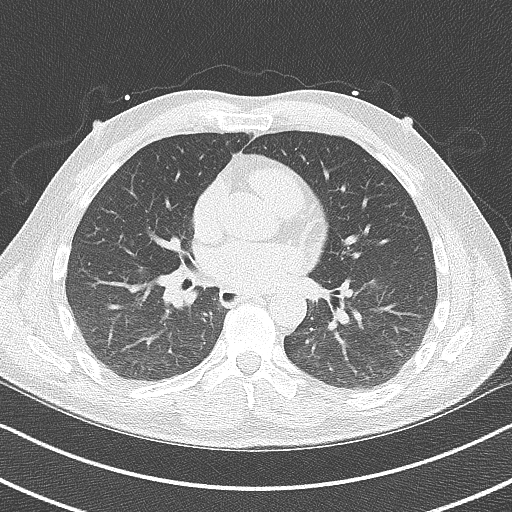
[im 38/46  lung]
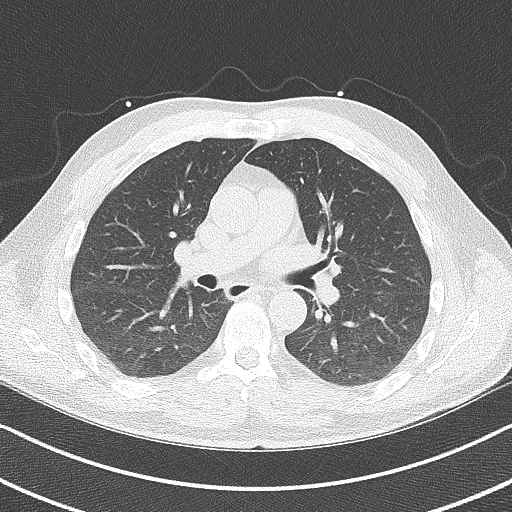

[Series 4: lung st 71 % · axial · 0.68mm/px · z∈[-228,-138]mm · 5 of 46 slices shown]
[im 8/46  lung]
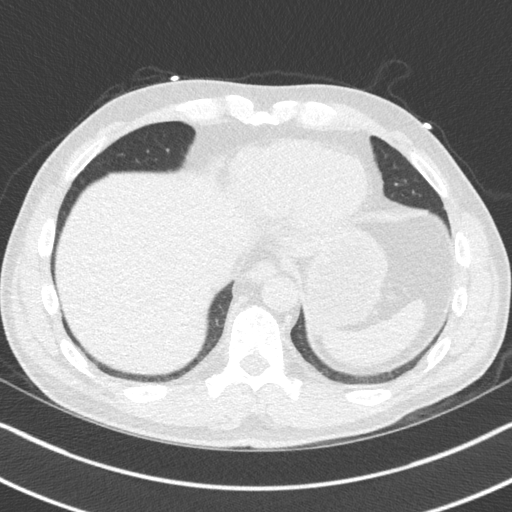
[im 16/46  lung]
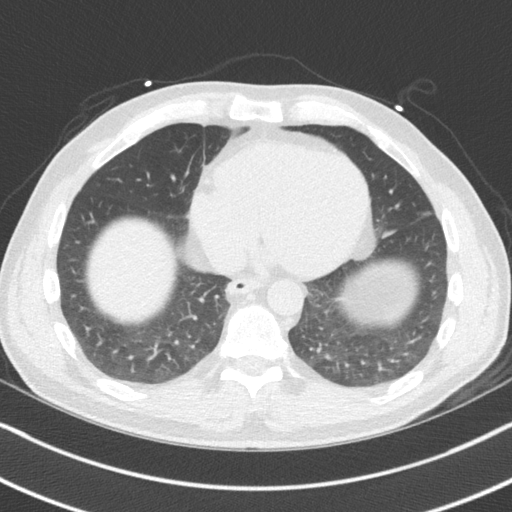
[im 23/46  lung]
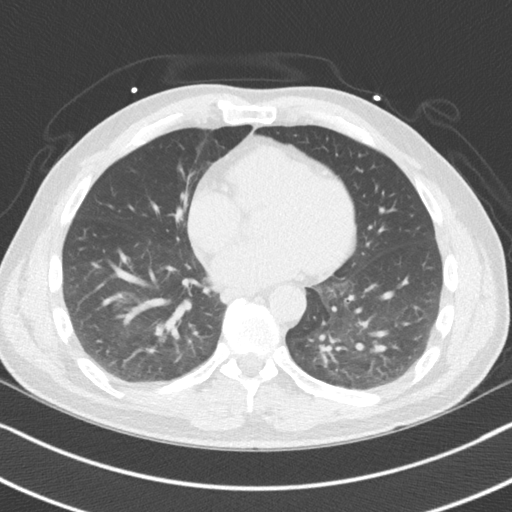
[im 31/46  lung]
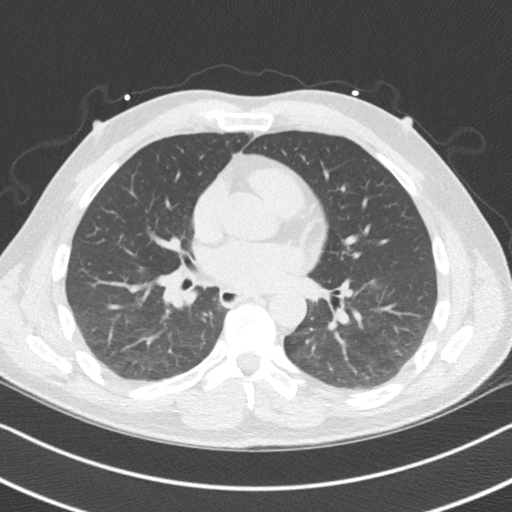
[im 38/46  lung]
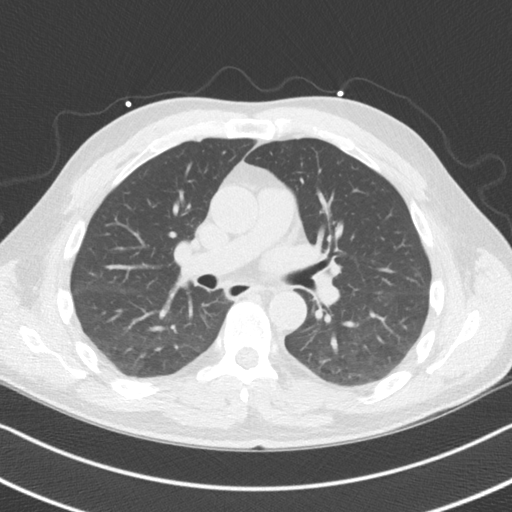

[14 of 20 positions shown; findings below may reference images not displayed]

FINDINGS: Within the visualized portions of the thorax there are no suspicious
appearing pulmonary nodules or masses, there is no acute
consolidative airspace disease, no pleural effusions, no
pneumothorax and no lymphadenopathy. Visualized portions of the
upper abdomen are unremarkable. There are no aggressive appearing
lytic or blastic lesions noted in the visualized portions of the
skeleton.
IMPRESSION: No significant incidental noncardiac findings are noted.
FINDINGS: Non-cardiac: See separate report from [REDACTED].

Ascending Aorta: Normal caliber

Pericardium: Normal

Coronary arteries: Normal origins. LM trifurcates into the LAD,
Ramus intermedius and LCx arteries.
IMPRESSION: Coronary calcium score of 0. This is a low risk study.

*** End of Addendum ***
EXAM:
OVER-READ INTERPRETATION  CT CHEST

The following report is an over-read performed by radiologist Dr.
Cristian Doty [REDACTED] on 07/31/2020. This
over-read does not include interpretation of cardiac or coronary
anatomy or pathology. The coronary calcium score interpretation by
the cardiologist is attached.
FINDINGS: Within the visualized portions of the thorax there are no suspicious
appearing pulmonary nodules or masses, there is no acute
consolidative airspace disease, no pleural effusions, no
pneumothorax and no lymphadenopathy. Visualized portions of the
upper abdomen are unremarkable. There are no aggressive appearing
lytic or blastic lesions noted in the visualized portions of the
skeleton.
IMPRESSION: No significant incidental noncardiac findings are noted.

## 2021-05-26 ENCOUNTER — Other Ambulatory Visit: Payer: Self-pay | Admitting: Internal Medicine

## 2021-06-04 ENCOUNTER — Other Ambulatory Visit: Payer: Self-pay | Admitting: Internal Medicine

## 2021-06-04 DIAGNOSIS — E785 Hyperlipidemia, unspecified: Secondary | ICD-10-CM

## 2021-06-04 DIAGNOSIS — Z8249 Family history of ischemic heart disease and other diseases of the circulatory system: Secondary | ICD-10-CM

## 2021-08-27 ENCOUNTER — Other Ambulatory Visit: Payer: Self-pay | Admitting: Internal Medicine

## 2021-08-27 ENCOUNTER — Encounter: Payer: Self-pay | Admitting: Internal Medicine

## 2021-08-27 DIAGNOSIS — E785 Hyperlipidemia, unspecified: Secondary | ICD-10-CM

## 2021-09-21 LAB — NMR, LIPOPROFILE
Cholesterol, Total: 149 mg/dL (ref 100–199)
HDL Particle Number: 38.2 umol/L (ref >=30.5)
HDL-C: 51 mg/dL (ref >39)
LDL Particle Number: 1079 nmol/L — ABNORMAL HIGH (ref <1000)
LDL Size: 20.4 nm — ABNORMAL LOW (ref >20.5)
LDL-C (NIH Calc): 80 mg/dL (ref 0–99)
LP-IR Score: 53 — ABNORMAL HIGH (ref <=45)
Small LDL Particle Number: 612 nmol/L — ABNORMAL HIGH (ref <=527)
Triglycerides: 99 mg/dL (ref 0–149)

## 2021-09-28 ENCOUNTER — Encounter: Payer: Self-pay | Admitting: Internal Medicine

## 2021-10-17 ENCOUNTER — Other Ambulatory Visit: Payer: Self-pay

## 2021-10-17 ENCOUNTER — Telehealth (INDEPENDENT_AMBULATORY_CARE_PROVIDER_SITE_OTHER): Payer: 59 | Admitting: Internal Medicine

## 2021-10-17 ENCOUNTER — Encounter: Payer: Self-pay | Admitting: Internal Medicine

## 2021-10-17 VITALS — BP 110/60 | HR 97 | Wt 178.0 lb

## 2021-10-17 DIAGNOSIS — E785 Hyperlipidemia, unspecified: Secondary | ICD-10-CM

## 2021-10-17 DIAGNOSIS — Z8249 Family history of ischemic heart disease and other diseases of the circulatory system: Secondary | ICD-10-CM

## 2021-10-17 DIAGNOSIS — I10 Essential (primary) hypertension: Secondary | ICD-10-CM | POA: Diagnosis not present

## 2021-10-17 NOTE — Patient Instructions (Signed)
Medication Instructions:  ?Your physician recommends that you continue on your current medications as directed. Please refer to the Current Medication list given to you today. ? ?*If you need a refill on your cardiac medications before your next appointment, please call your pharmacy* ? ? ?Lab Work: ?FASTING NMR lipoprofile in 1 year to check cholesterol  ?-- complete before next appointment with Dr. Debara Pickett  ? ?If you have labs (blood work) drawn today and your tests are completely normal, you will receive your results only by: ?MyChart Message (if you have MyChart) OR ?A paper copy in the mail ?If you have any lab test that is abnormal or we need to change your treatment, we will call you to review the results. ? ? ?Testing/Procedures: ?NONE ? ? ?Follow-Up: ?At [facility], you and your health needs are our priority.  As part of our continuing mission to provide you with exceptional heart care, we have created designated Provider Care Teams.  These Care Teams include your primary Cardiologist (physician) and Advanced Practice Providers (APPs -  Physician Assistants and Nurse Practitioners) who all work together to provide you with the care you need, when you need it. ? ?We recommend signing up for the patient portal called "MyChart".  Sign up information is provided on this After Visit Summary.  MyChart is used to connect with patients for Virtual Visits (Telemedicine).  Patients are able to view lab/test results, encounter notes, upcoming appointments, etc.  Non-urgent messages can be sent to your provider as well.   ?To learn more about what you can do with MyChart, go to [email or web address].   ? ?Your next appointment:   ? ?1 year LIPID CLINIC appointment with Dr. Debara Pickett  ?

## 2021-10-17 NOTE — Progress Notes (Signed)
? ?Virtual Visit via Video Note  ? ?This visit type was conducted due to national recommendations for restrictions regarding the COVID-19 Pandemic (e.g. social distancing) in an effort to limit this patient's exposure and mitigate transmission in our community.  Due to his co-morbid illnesses, this patient is at least at moderate risk for complications without adequate follow up.  This format is felt to be most appropriate for this patient at this time.  All issues noted in this document were discussed and addressed.  A limited physical exam was performed with this format.  Please refer to the patient's chart for his consent to telehealth for [facility]. ? ?   ? ?Date:  10/17/2021  ? ?ID:  Russell Watkins, DOB [birth year], MRN [id number] ?The patient was identified using 2 identifiers. ? ?Evaluation Performed:  Follow-Up Visit ? ?Patient Location:  ?[address] ?[place] [address] ? ?Provider location:   ?[address] ?[place], [place] [address] ? ?PCP:  Sharilyn Sites, MD  ?Cardiologist:  Pixie Casino, MD ?Electrophysiologist:  None  ? ?Chief Complaint:  Follow-up dyslipidemia ? ?History of Present Illness:   ? ?Russell Watkins is a 61 y.o. male who presents via audio/video conferencing for a telehealth visit today.  Mr. Russell Watkins is a pleasant male followed for years for cardiovascular risk reduction.  Has a history of dyslipidemia and hypertension as well as family history of heart disease.  He also struggles with insomnia improved with Ambien.  He is followed closely by [name] in [place].  Recently had a complete physical including lab work, EKG and evaluation.  He sent those results and I reviewed them all of which are very reassuring.  He has had small increase in his lipid profile over the past year.  He says his diet has not been as ideal and exercise last because of dealing with his mother's health issues.  An NMR LipoProfile in February which showed a  particle #[id number], LDL of 80, HDL 51, triglycerides 9 and total cholesterol 149, small LDL-P up to 612.  Previously his LDL particle number was less than at thousand with small LDL P less than 500 and LDL-C of 69.  He does feel that dietary and lifestyle contributed to this and I would agree.  Otherwise he is doing well without any chest pain or shortness of breath. ? ?The patient does not have symptoms concerning for COVID-19 infection (fever, chills, cough, or new SHORTNESS OF BREATH).  ? ? ?Prior CV studies:   ?The following studies were reviewed today: ? ?Chart reviewed, lab work ? ?PMHx:  ?Past Medical History:  ?Diagnosis Date  ? Dyslipidemia   ? Family history of heart disease   ? Hypertension   ? Insomnia   ? ? ?Past Surgical History:  ?Procedure Laterality Date  ? CARDIAC CATHETERIZATION  05/31/2005  ? normal LV systolic function, normal coronaries ([name])  ? NM MYOCAR PERF WALL MOTION  02/2009  ? bruce myoview; no evidence of significant ischemia, normal pattern of perfusion, post-stress EF 70%  ? TRANSTHORACIC ECHOCARDIOGRAM  2004  ? RV mildly dilated, RA mildly dilated, mild TR  ? ? ?FAMHx:  ?Family History  ?Problem Relation Age of Onset  ? Coronary artery disease Mother   ?     CABG  ? Stroke Paternal Grandfather   ? ? ?SOCHx:  ? reports that he has never smoked. He has never used smokeless tobacco. He reports current alcohol  use. He reports that he does not use drugs. ? ?ALLERGIES:  ?No Known Allergies ? ?MEDS: ? ?Current Meds  ?Medication Sig  ? ALPRAZolam (XANAX) 0.5 MG tablet Take 0.5 mg by mouth 3 (three) times daily as needed.  ? fenofibrate (TRICOR) 145 MG tablet TAKE 1 TABLET(145 MG) BY MOUTH AT BEDTIME  ? rosuvastatin (CRESTOR) 10 MG tablet TAKE 1 TABLET(10 MG) BY MOUTH AT BEDTIME  ?  ? ?ROS: ?Pertinent items noted in HPI and remainder of comprehensive ROS otherwise negative. ? ?Labs/Other Tests and Data Reviewed:   ? ?Recent Labs: ?No results found for requested labs within last 8760  hours.  ? ?Recent Lipid Panel ?Lab Results  ?Component Value Date/Time  ? CHOL 137 12/26/2016 09:09 AM  ? CHOL 148 12/08/2014 10:05 AM  ? TRIG 89 12/26/2016 09:09 AM  ? TRIG 103 12/08/2014 10:05 AM  ? HDL 41 12/26/2016 09:09 AM  ? HDL 47 12/08/2014 10:05 AM  ? CHOLHDL 3.3 12/26/2016 09:09 AM  ? LDLCALC 79 12/26/2016 09:09 AM  ? LDLCALC 80 12/08/2014 10:05 AM  ? ? ?Wt Readings from Last 3 Encounters:  ?10/17/21 178 lb (80.7 kg)  ?07/27/20 181 lb 9.6 oz (82.4 kg)  ?11/17/19 185 lb (83.9 kg)  ?  ? ?Exam:   ? ?Vital Signs:  BP 110/60   Pulse 97   Wt 178 lb (80.7 kg)   BMI 24.83 kg/m?   ? ?General appearance: alert and no distress ?Lungs: No visual respiratory difficulty ?Abdomen: Normal weight ?Extremities: extremities normal, atraumatic, no cyanosis or edema ?Skin: Skin color, texture, turgor normal. No rashes or lesions ?Neurologic: Grossly normal ?Psych: Pleasant ? ?EKG: ?Personally reviewed EKG from Dr. Delanna Ahmadi office in February 2023 demonstrated normal sinus rhythm ? ?ASSESSMENT & PLAN:   ? ?Russell Watkins continues to do well with a small increase in his lipids.  This is mostly due to diet and decreased physical activity and stress probably from caring from his mother over the past year.  Otherwise blood pressure control is excellent.  He has no symptomatic coronary disease.  Would continue his current therapies.  Plan follow-up with me annually or sooner as necessary. ? ?COVID-19 Education: ?The signs and symptoms of COVID-19 were discussed with the patient and how to seek care for testing (follow up with PCP or arrange E-visit).  The importance of social distancing was discussed today. ? ?Patient Risk:   ?After full review of this patients clinical status, I feel that they are at least moderate risk at this time. ? ?Time:   ?Today, I have spent 25 minutes with the patient with telehealth technology discussing dyslipidemia, hypertension, cardiovascular risk factor modification.   ? ? ?Medication Adjustments/Labs and  Tests Ordered: ?Current medicines are reviewed at length with the patient today.  Concerns regarding medicines are outlined above.  ? ?Tests Ordered: ?Orders Placed This Encounter  ?Procedures  ? NMR, lipoprofile  ? ? ?Medication Changes: ?No orders of the defined types were placed in this encounter. ? ? ?Disposition:  in 1 year(s) ? ?Pixie Casino, MD, [facility], FACP  ?[place]  ?Medical Director of the Advanced Lipid Disorders &  ?Cardiovascular Risk Reduction Clinic ?Diplomate of the [employer] of Clinical Lipidology ?Attending Cardiologist  ?Direct Dial: [phone]  Fax: [phone]  ?Website:  [email or web address] ? ?Pixie Casino, MD  ?10/17/2021 8:36 AM    ? ? ?

## 2021-12-05 ENCOUNTER — Other Ambulatory Visit: Payer: Self-pay | Admitting: Internal Medicine

## 2022-03-11 ENCOUNTER — Other Ambulatory Visit: Payer: Self-pay | Admitting: Internal Medicine

## 2022-08-21 ENCOUNTER — Other Ambulatory Visit: Payer: Self-pay | Admitting: *Deleted

## 2022-08-21 DIAGNOSIS — E785 Hyperlipidemia, unspecified: Secondary | ICD-10-CM

## 2022-09-24 DIAGNOSIS — R7309 Other abnormal glucose: Secondary | ICD-10-CM | POA: Diagnosis not present

## 2022-09-24 DIAGNOSIS — Z0001 Encounter for general adult medical examination with abnormal findings: Secondary | ICD-10-CM | POA: Diagnosis not present

## 2022-09-24 DIAGNOSIS — E7849 Other hyperlipidemia: Secondary | ICD-10-CM | POA: Diagnosis not present

## 2022-09-26 LAB — NMR, LIPOPROFILE
Cholesterol, Total: 148 mg/dL (ref 100–199)
HDL Particle Number: 38.3 umol/L (ref >=30.5)
HDL-C: 48 mg/dL (ref >39)
LDL Particle Number: 1184 nmol/L — ABNORMAL HIGH (ref <1000)
LDL Size: 20.3 nm — ABNORMAL LOW (ref >20.5)
LDL-C (NIH Calc): 84 mg/dL (ref 0–99)
LP-IR Score: 65 — ABNORMAL HIGH (ref <=45)
Small LDL Particle Number: 573 nmol/L — ABNORMAL HIGH (ref <=527)
Triglycerides: 81 mg/dL (ref 0–149)

## 2022-09-27 ENCOUNTER — Encounter: Payer: Self-pay | Admitting: Internal Medicine

## 2022-09-27 DIAGNOSIS — R001 Bradycardia, unspecified: Secondary | ICD-10-CM | POA: Diagnosis not present

## 2022-09-27 DIAGNOSIS — G4700 Insomnia, unspecified: Secondary | ICD-10-CM | POA: Diagnosis not present

## 2022-09-27 DIAGNOSIS — R7309 Other abnormal glucose: Secondary | ICD-10-CM | POA: Diagnosis not present

## 2022-09-27 DIAGNOSIS — R69 Illness, unspecified: Secondary | ICD-10-CM | POA: Diagnosis not present

## 2022-09-27 DIAGNOSIS — Z125 Encounter for screening for malignant neoplasm of prostate: Secondary | ICD-10-CM | POA: Diagnosis not present

## 2022-09-27 DIAGNOSIS — K219 Gastro-esophageal reflux disease without esophagitis: Secondary | ICD-10-CM | POA: Diagnosis not present

## 2022-09-27 DIAGNOSIS — Z0001 Encounter for general adult medical examination with abnormal findings: Secondary | ICD-10-CM | POA: Diagnosis not present

## 2022-09-27 DIAGNOSIS — E663 Overweight: Secondary | ICD-10-CM | POA: Diagnosis not present

## 2022-09-27 DIAGNOSIS — Z1331 Encounter for screening for depression: Secondary | ICD-10-CM | POA: Diagnosis not present

## 2022-09-27 DIAGNOSIS — E782 Mixed hyperlipidemia: Secondary | ICD-10-CM | POA: Diagnosis not present

## 2022-10-16 ENCOUNTER — Ambulatory Visit: Payer: 59 | Attending: Internal Medicine | Admitting: Internal Medicine

## 2022-10-16 ENCOUNTER — Encounter: Payer: Self-pay | Admitting: Internal Medicine

## 2022-10-16 VITALS — BP 116/76 | HR 64 | Resp 97 | Ht 71.0 in | Wt 184.8 lb

## 2022-10-16 DIAGNOSIS — E785 Hyperlipidemia, unspecified: Secondary | ICD-10-CM

## 2022-10-16 DIAGNOSIS — I10 Essential (primary) hypertension: Secondary | ICD-10-CM | POA: Diagnosis not present

## 2022-10-16 DIAGNOSIS — Z8249 Family history of ischemic heart disease and other diseases of the circulatory system: Secondary | ICD-10-CM | POA: Diagnosis not present

## 2022-10-16 NOTE — Patient Instructions (Signed)
Medication Instructions:  Your physician recommends that you continue on your current medications as directed. Please refer to the Current Medication list given to you today.  *If you need a refill on your cardiac medications before your next appointment, please call your pharmacy*   Lab Work: FASTING lab work in 1 year  If you have labs (blood work) drawn today and your tests are completely normal, you will receive your results only by: [place] (if you have MyChart) OR A paper copy in the mail If you have any lab test that is abnormal or we need to change your treatment, we will call you to review the results.   Follow-Up: At [facility], you and your health needs are our priority.  As part of our continuing mission to provide you with exceptional heart care, we have created designated Provider Care Teams.  These Care Teams include your primary Cardiologist (physician) and Advanced Practice Providers (APPs -  Physician Assistants and Nurse Practitioners) who all work together to provide you with the care you need, when you need it.  We recommend signing up for the patient portal called "MyChart".  Sign up information is provided on this After Visit Summary.  MyChart is used to connect with patients for Virtual Visits (Telemedicine).  Patients are able to view lab/test results, encounter notes, upcoming appointments, etc.  Non-urgent messages can be sent to your provider as well.   To learn more about what you can do with MyChart, go to [email or web address].    Your next appointment:    12 months with Dr. Debara Pickett

## 2022-10-16 NOTE — Progress Notes (Signed)
OFFICE NOTE  Chief Complaint:  No complaints  Primary Care Physician: Sharilyn Sites, MD  HPI:  Russell Watkins is a pleasant 62 year old gentleman with a history of coronary disease in the family, dyslipidemia and hypertension. However, he had marked weight loss and is no longer hypertensive. He has been on Crestor 10 mg which was changed to from pravastatin after a lipid profile in January 2013 which showed LDL particle number of 1486, an LDL cholesterol of 62, triglycerides 246, and LDL particle number of 877. Overall, fairly good control; however, there are still a higher number of LDL particles. He also was started on fenofibrate 145 for the increased triglycerides and seems to be tolerating the medicines well. Overall, he has no complaints. A repeat lipid profile performed in January 2014 demonstrated an LDL particle number of 1073, LDL-C. of 69, HDL-C. 45 and triglycerides at 117. This demonstrates excellent control.  Otherwise he has done well over the past year denies any chest pain or shortness of breath with exertion.  Russell Watkins is doing well overall. Denies a chest pain or shortness of breath. He remains active. Repeat cholesterol testing indicates a slightly higher particle number with LDL-P of 1374, the LDL C is 80, HDL 47 and triglycerides of 103. This is still an overall favorable profile however particle numbers are up. This is primarily due to high the VLDL number. He'll will need to continue to work on diet and exercise. This point is no indication to change his medications.  02/13/2016  Russell Watkins was seen back in the office today for follow-up. Overall he seems to be doing quite well. He denies any chest pain or worsening shortness of breath. He maintains an active lifestyle. He recently had a lipid profile drawn on 02/09/2016 however those results are still pending. There have been no new occurrences with regards to his health history over the past year.  12/30/2016  Mr.  Watkins returns today for follow-up. Overall seems to be doing very well. He suffering from some seasonal allergies including itchy eyes and runny nose. He recently had repeat lab work looking at his cholesterol profile however that still pending. Otherwise is asymptomatic. Denies any chest pain or worsening shortness of breath with exertion. Weight is been fairly stable although he wants to lose more weight. He intends to start more regular exercise as well as playing more golf.  05/07/2018  Russell Watkins is seen today in follow-up.  This is an annual visit and is without complaints.  He recently moved and is working on setting up home gym.  He says that his diet has not been as adequate recently.  We did repeat recent lipid testing including a lipoprotein NMR.  This indicates slightly worse cholesterol numbers however still reasonably good control.  His LDL-P was 1000, 119.  LDL-C was 85, HDL-C 48, triglycerides 61 and he is a small LDL particle number was 664.  He reports compliance with fenofibrate and rosuvastatin.  His diet may not be as healthy as it had been last year.  05/20/2019  Russell Watkins returns today for follow-up.  Overall he continues to do well.  Denies any chest pain or worsening shortness of breath.  The lipoprotein NMR which showed an LDL particle number of 1355, LDL-C of 95, HDL of 44 and triglycerides 96.  This does represent a small increase in his lipids however small LDL particle numbers improved from 664-549.  I suspect this is mostly related to diet and decreased exercise.  He intends to start working on that more.  07/27/2020  Russell Watkins is seen today in follow-up.  He has made some further dietary changes.  His lipids have improved significantly.  His LDL-P was 1355, now down to 909.  LDL-C is 69, HDL-C is 46 and triglycerides of 76.  Overall he feels well.  He has no chest pain or worsening shortness of breath.  I previously ordered a calcium score which he had forgotten about but  did schedule and will be having an on December 13.  He may be interested in getting some lab since he is had difficulty getting in with his primary care provider.  He apparently lives here in [place].  10/16/2022  Russell Watkins is seen today in follow-up.  Overall he seems to be doing well although continues to have some stress in caring for his mother.  He denies any chest pain or shortness of breath.  He has had some recent weight gain.  He understands he needs to make dietary changes and get more exercise for this.  Blood pressure is well-controlled today.  EKG submitted recently by him is normal.  He is compliant with his medications.  PMHx:  Past Medical History:  Diagnosis Date   Dyslipidemia    Family history of heart disease    Hypertension    Insomnia     Past Surgical History:  Procedure Laterality Date   CARDIAC CATHETERIZATION  05/31/2005   normal LV systolic function, normal coronaries ([name])   [place]  02/2009   bruce myoview; no evidence of significant ischemia, normal pattern of perfusion, post-stress EF 70%   TRANSTHORACIC ECHOCARDIOGRAM  2004   RV mildly dilated, RA mildly dilated, mild TR    FAMHx:  Family History  Problem Relation Age of Onset   Coronary artery disease Mother        CABG   Stroke Paternal Grandfather     SOCHx:   reports that he has never smoked. He has never used smokeless tobacco. He reports current alcohol use. He reports that he does not use drugs.  ALLERGIES:  No Known Allergies  ROS: Pertinent items noted in HPI and remainder of comprehensive ROS otherwise negative.  HOME MEDS: Current Outpatient Medications  Medication Sig Dispense Refill   ALPRAZolam (XANAX) 0.5 MG tablet Take 0.5 mg by mouth 3 (three) times daily as needed.     fenofibrate (TRICOR) 145 MG tablet TAKE 1 TABLET(145 MG) BY MOUTH AT BEDTIME 90 tablet 2   rosuvastatin (CRESTOR) 10 MG tablet TAKE 1 TABLET(10 MG) BY MOUTH AT BEDTIME 90  tablet 2   zolpidem (AMBIEN) 10 MG tablet Take 1 tablet (10 mg total) by mouth at bedtime as needed for sleep. 90 tablet 0   No current facility-administered medications for this visit.    LABS/IMAGING: No results found for this or any previous visit (from the past 48 hour(s)). No results found.  VITALS: BP 116/76 (BP Location: Left Arm, Patient Position: Sitting, Cuff Size: Large)   Pulse 64   Resp (!) 97   Ht '5\' 11"'$  (1.803 m)   Wt 184 lb 12.8 oz (83.8 kg)   BMI 25.77 kg/m   EXAM: General appearance: alert, no distress and thin Neck: no carotid bruit, no JVD and thyroid not enlarged, symmetric, no tenderness/mass/nodules Lungs: clear to auscultation bilaterally Heart: regular rate and rhythm, S1, S2 normal, no murmur, click, rub or gallop Abdomen: soft, non-tender; bowel sounds normal;  no masses,  no organomegaly Extremities: extremities normal, atraumatic, no cyanosis or edema Pulses: 2+ and symmetric Skin: Skin color, texture, turgor normal. No rashes or lesions Neurologic: Mental status: Alert, oriented, thought content appropriate Psych: Pleasant  EKG: Deferred  ASSESSMENT: Dyslipidemia Family history of CAD Seasonal allergic rhinitis  PLAN: 1.   Mr. Hollenberg seems to be doing well without any chest pain or shortness of breath.  He is under a good bit of stress with his mother.  His labs are reasonably well treated despite the fact that he has had with some weight gain and dietary discretion.  He continues to express his desire to work on that.  Follow-up with me annually or sooner as necessary.  Pixie Casino, MD, [facility], [place] Director of the Advanced Lipid Disorders &  Cardiovascular Risk Reduction Clinic Diplomate of the American Board of Clinical Lipidology Attending Cardiologist  Direct Dial: [phone]  Fax: [phone]  Website:  [email or web address] 10/16/2022, 8:54 AM

## 2022-12-18 ENCOUNTER — Other Ambulatory Visit: Payer: Self-pay | Admitting: Internal Medicine

## 2023-03-29 ENCOUNTER — Telehealth: Payer: Self-pay

## 2023-03-29 ENCOUNTER — Ambulatory Visit
Admission: EM | Admit: 2023-03-29 | Discharge: 2023-03-29 | Disposition: A | Discharge status: Home / Self Care | Payer: 59 | Attending: Family Medicine | Admitting: Family Medicine

## 2023-03-29 DIAGNOSIS — U071 COVID-19: Secondary | ICD-10-CM | POA: Diagnosis not present

## 2023-03-29 MED ORDER — MOLNUPIRAVIR EUA 200MG CAPSULE: 4.0000 | ORAL_CAPSULE | Freq: Two times a day (BID) | ORAL | 0 refills | Status: DC

## 2023-03-29 MED ORDER — PROMETHAZINE-DM 6.25-15 MG/5ML PO SYRP: 5.0000 mL | ORAL_SOLUTION | Freq: Four times a day (QID) | ORAL | 0 refills | Status: DC | PRN

## 2023-03-29 MED ORDER — MOLNUPIRAVIR EUA 200MG CAPSULE: 4.0000 | ORAL_CAPSULE | Freq: Two times a day (BID) | ORAL | 0 refills | Status: AC

## 2023-03-29 NOTE — ED Provider Notes (Signed)
[facility]    CSN: [id number] Arrival date & time: 03/29/23  1043      History   Chief Complaint No chief complaint on file.   HPI Russell Watkins is a 62 y.o. male.   Patient presenting today with 1 day history of headache, weakness, congestion, cough, loss of taste, headache.  Denies chest pain, shortness of breath, abdominal pain, nausea vomiting or diarrhea.  So far not trying anything over-the-counter for symptoms.  Home COVID test was positive.  No known chronic pulmonary disease.    Past Medical History:  Diagnosis Date   Dyslipidemia    Family history of heart disease    Hypertension    Insomnia     Patient Active Problem List   Diagnosis Date Noted   Seasonal allergic rhinitis 12/30/2016   Dyslipidemia 09/21/2013   Family history of premature CAD 09/21/2013    Past Surgical History:  Procedure Laterality Date   CARDIAC CATHETERIZATION  05/31/2005   normal LV systolic function, normal coronaries (Dr. Evlyn Courier)   NM MYOCAR PERF WALL MOTION  02/2009   bruce myoview; no evidence of significant ischemia, normal pattern of perfusion, post-stress EF 70%   TRANSTHORACIC ECHOCARDIOGRAM  2004   RV mildly dilated, RA mildly dilated, mild TR       Home Medications    Prior to Admission medications   Medication Sig Start Date End Date Taking? Authorizing Provider  molnupiravir EUA (LAGEVRIO) 200 mg CAPS capsule Take 4 capsules (800 mg total) by mouth 2 (two) times daily for 5 days. 03/29/23 04/03/23 Yes Particia Nearing, PA-C  promethazine-dextromethorphan (PROMETHAZINE-DM) 6.25-15 MG/5ML syrup Take 5 mLs by mouth 4 (four) times daily as needed. 03/29/23  Yes Particia Nearing, PA-C  ALPRAZolam Prudy Feeler) 0.5 MG tablet Take 0.5 mg by mouth 3 (three) times daily as needed. 08/16/21   [provider]  fenofibrate (TRICOR) 145 MG tablet TAKE 1 TABLET BY MOUTH AT BEDTIME 12/18/22   [name], Lisette Abu, MD  rosuvastatin (CRESTOR) 10 MG tablet TAKE  1 TABLET BY MOUTH AT BEDTIME 12/18/22   [name], Lisette Abu, MD  zolpidem (AMBIEN) 10 MG tablet Take 1 tablet (10 mg total) by mouth at bedtime as needed for sleep. 08/08/20 10/16/22  Lew Dawes, PA-C    Family History Family History  Problem Relation Age of Onset   Coronary artery disease Mother        CABG   Stroke Paternal Grandfather     Social History Social History   Tobacco Use   Smoking status: Never   Smokeless tobacco: Never  Substance Use Topics   Alcohol use: Yes    Comment: on weekends   Drug use: No     Allergies   Patient has no known allergies.   Review of Systems Review of Systems Per HPI  Physical Exam Triage Vital Signs ED Triage Vitals [03/29/23 1054]  Encounter Vitals Group     BP 129/79     Systolic BP Percentile      Diastolic BP Percentile      Pulse Rate 81     Resp 20     Temp 98 F (36.7 C)     Temp Source Oral     SpO2 97 %     Weight      Height      Head Circumference      Peak Flow      Pain Score 0     Pain Loc  Pain Education      Exclude from Growth Chart    No data found.  Updated Vital Signs BP 129/79 (BP Location: Right Arm)   Pulse 81   Temp 98 F (36.7 C) (Oral)   Resp 20   SpO2 97%   Visual Acuity Right Eye Distance:   Left Eye Distance:   Bilateral Distance:    Right Eye Near:   Left Eye Near:    Bilateral Near:     Physical Exam Vitals and nursing note reviewed.  Constitutional:      Appearance: Normal appearance.  HENT:     Head: Atraumatic.     Right Ear: Tympanic membrane normal.     Left Ear: Tympanic membrane normal.     Nose: Rhinorrhea present.     Mouth/Throat:     Mouth: Mucous membranes are moist.     Pharynx: Oropharynx is clear. Posterior oropharyngeal erythema present. No oropharyngeal exudate.  Eyes:     Extraocular Movements: Extraocular movements intact.     Conjunctiva/sclera: Conjunctivae normal.  Cardiovascular:     Rate and Rhythm: Normal rate and regular  rhythm.  Pulmonary:     Effort: Pulmonary effort is normal.     Breath sounds: Normal breath sounds. No wheezing or rales.  Musculoskeletal:        General: Normal range of motion.     Cervical back: Normal range of motion and neck supple.  Skin:    General: Skin is warm and dry.  Neurological:     General: No focal deficit present.     Mental Status: He is oriented to person, place, and time.     Motor: No weakness.     Gait: Gait normal.  Psychiatric:        Mood and Affect: Mood normal.        Thought Content: Thought content normal.        Judgment: Judgment normal.      UC Treatments / Results  Labs (all labs ordered are listed, but only abnormal results are displayed) Labs Reviewed - No data to display  EKG   Radiology No results found.  Procedures Procedures (including critical care time)  Medications Ordered in UC Medications - No data to display  Initial Impression / Assessment and Plan / UC Course  I have reviewed the triage vital signs and the nursing notes.  Pertinent labs & imaging results that were available during my care of the patient were reviewed by me and considered in my medical decision making (see chart for details).     Vitals and exam reassuring and suggestive of viral illness, consistent with COVID-19 test being positive.  Will start molnupiravir, Phenergan DM and discussed supportive over-the-counter medications and home care.  Return for worsening symptoms.  Final Clinical Impressions(s) / UC Diagnoses   Final diagnoses:  COVID-19   Discharge Instructions   None    ED Prescriptions     Medication Sig Dispense Auth. Provider   molnupiravir EUA (LAGEVRIO) 200 mg CAPS capsule Take 4 capsules (800 mg total) by mouth 2 (two) times daily for 5 days. 40 capsule Particia Nearing, [place]   promethazine-dextromethorphan (PROMETHAZINE-DM) 6.25-15 MG/5ML syrup Take 5 mLs by mouth 4 (four) times daily as needed. 100 mL Particia Nearing, [place]      PDMP not reviewed this encounter.   Particia Nearing, [place] 03/29/23 1358

## 2023-03-29 NOTE — ED Triage Notes (Signed)
Pt reports loss of taste,headache, weakness, and chest congestion x 1 day  Home covid test came back positive.

## 2023-03-29 NOTE — Telephone Encounter (Signed)
Pt wanted meds switched to [facility] [address] because he cannot afford them.    Scripts sent to correct pharmacy

## 2023-06-30 ENCOUNTER — Encounter: Payer: Self-pay | Admitting: Gastroenterology

## 2023-07-24 ENCOUNTER — Ambulatory Visit: Payer: 59 | Admitting: *Deleted

## 2023-07-24 VITALS — Ht 71.0 in | Wt 175.0 lb

## 2023-07-24 DIAGNOSIS — Z1211 Encounter for screening for malignant neoplasm of colon: Secondary | ICD-10-CM

## 2023-07-24 MED ORDER — NA SULFATE-K SULFATE-MG SULF 17.5-3.13-1.6 GM/177ML PO SOLN: 1.0000 | Freq: Once | ORAL | 0 refills | Status: AC

## 2023-07-24 NOTE — Progress Notes (Signed)
Pt's name and DOB verified at the beginning of the pre-visit wit 2 identifiers  Pt denies any difficulty with ambulating,sitting, laying down or rolling side to side  Pt has no issues with ambulation   Pt has no issues moving head neck or swallowing  No egg or soy allergy known to patient   No issues known to pt with past sedation with any surgeries or procedures  Patient denies ever being intubated  No FH of Malignant Hyperthermia  Pt is not on diet pills or shots  Pt is not on home 02   Pt is not on blood thinners   Pt denies issues with constipation   Pt is not on dialysis  Pt denise any abnormal heart rhythms   Pt denies any upcoming cardiac testing  Pt encouraged to use to use Singlecare or Goodrx to reduce cost   Patient's chart reviewed by Cathlyn Parsons CNRA prior to pre-visit and patient appropriate for the [facility].  Pre-visit completed and red dot placed by patient's name on their procedure day (on provider's schedule).  .  Visit by phone  Pt states weight is 175 lb  Instructed pt why it is important to and  to call if they have any changes in health or new medications. Directed them to the # given and on instructions.     Instructions reviewed. Pt given both [facility] main # and MD on call # prior to instructions.  Pt states understanding. Instructed to review again prior to procedure. Pt states they will.   Instructions sent by mail with coupon and by My Chart  Coupon sent via text to mobile phone and pt verified they received it

## 2023-07-30 ENCOUNTER — Telehealth: Payer: Self-pay | Admitting: Gastroenterology

## 2023-07-30 NOTE — Telephone Encounter (Signed)
Inbound call from patient stating he had been in contact with his insurance and they advised him the way we were going to code his procedure on 12/23 was diagnostic. Patient stated that was incorrect and is requesting a call to discuss. Please advise.

## 2023-07-31 ENCOUNTER — Encounter: Payer: Self-pay | Admitting: Gastroenterology

## 2023-08-01 NOTE — Telephone Encounter (Signed)
Russell Watkins    08/01/23 10:16 AM Good morning all, I just spoke to pt and answered all his questions.

## 2023-08-08 ENCOUNTER — Other Ambulatory Visit: Payer: Self-pay | Admitting: *Deleted

## 2023-08-08 DIAGNOSIS — E785 Hyperlipidemia, unspecified: Secondary | ICD-10-CM

## 2023-08-11 ENCOUNTER — Ambulatory Visit (AMBULATORY_SURGERY_CENTER): Payer: 59 | Admitting: Gastroenterology

## 2023-08-11 ENCOUNTER — Encounter: Payer: Self-pay | Admitting: Gastroenterology

## 2023-08-11 VITALS — BP 97/67 | HR 69 | Temp 97.9°F | Resp 14 | Ht 71.0 in | Wt 175.0 lb

## 2023-08-11 DIAGNOSIS — Z1211 Encounter for screening for malignant neoplasm of colon: Secondary | ICD-10-CM

## 2023-08-11 DIAGNOSIS — D120 Benign neoplasm of cecum: Secondary | ICD-10-CM

## 2023-08-11 DIAGNOSIS — D123 Benign neoplasm of transverse colon: Secondary | ICD-10-CM

## 2023-08-11 DIAGNOSIS — E785 Hyperlipidemia, unspecified: Secondary | ICD-10-CM | POA: Diagnosis not present

## 2023-08-11 DIAGNOSIS — K641 Second degree hemorrhoids: Secondary | ICD-10-CM | POA: Diagnosis not present

## 2023-08-11 DIAGNOSIS — D128 Benign neoplasm of rectum: Secondary | ICD-10-CM | POA: Diagnosis not present

## 2023-08-11 MED ORDER — SODIUM CHLORIDE 0.9 % IV SOLN: 500.0000 mL | Freq: Once | INTRAVENOUS | Status: DC

## 2023-08-11 NOTE — Progress Notes (Signed)
Sedate, gd SR, tolerated procedure well, VSS, report to RN 

## 2023-08-11 NOTE — Progress Notes (Signed)
Pt's states no medical or surgical changes since previsit or office visit. 

## 2023-08-11 NOTE — Progress Notes (Signed)
Called to room to assist during endoscopic procedure.  Patient ID and intended procedure confirmed with present staff. Received instructions for my participation in the procedure from the performing physician.  

## 2023-08-11 NOTE — Op Note (Signed)
Russell Watkins Patient Name: Russell Watkins Procedure Date: 08/11/2023 10:57 AM MRN: [id number] Endoscopist: Doristine Locks , MD, [id number] Age: 62 Referring MD:  Date of Birth: 06-14-1961 Gender: Male Account #: [id number] Procedure:                Colonoscopy Indications:              Screening for colorectal malignant neoplasm (last                            colonoscopy was 10 years ago in [place]) Medicines:                Monitored Anesthesia Care Procedure:                Pre-Anesthesia Assessment:                           - Prior to the procedure, a History and Physical                            was performed, and patient medications and                            allergies were reviewed. The patient's tolerance of                            previous anesthesia was also reviewed. The risks                            and benefits of the procedure and the sedation                            options and risks were discussed with the patient.                            All questions were answered, and informed consent                            was obtained. Prior Anticoagulants: The patient has                            taken no anticoagulant or antiplatelet agents. ASA                            Grade Assessment: II - A patient with mild systemic                            disease. After reviewing the risks and benefits,                            the patient was deemed in satisfactory condition to                            undergo the procedure.  After obtaining informed consent, the colonoscope                            was passed under direct vision. Throughout the                            procedure, the patient's blood pressure, pulse, and                            oxygen saturations were monitored continuously. The                            Olympus CF-HQ190L ([id number]) Colonoscope was                            introduced through the  anus and advanced to the the                            terminal ileum. The colonoscopy was performed                            without difficulty. The patient tolerated the                            procedure well. The quality of the bowel                            preparation was good. The terminal ileum, ileocecal                            valve, appendiceal orifice, and rectum were                            photographed. Scope In: 11:06:32 AM Scope Out: 11:24:02 AM Scope Withdrawal Time: 0 hours 16 minutes 2 seconds  Total Procedure Duration: 0 hours 17 minutes 30 seconds  Findings:                 The perianal and digital rectal examinations were                            normal.                           Two sessile polyps were found in the cecum. The                            polyps were 3 to 6 mm in size. These polyps were                            removed with a cold snare. Resection and retrieval                            were complete. Estimated blood loss was minimal.  A 4 mm polyp was found in the transverse colon. The                            polyp was sessile. The polyp was removed with a                            cold snare. Resection and retrieval were complete.                            Estimated blood loss was minimal.                           A 2 mm polyp was found in the rectum. The polyp was                            sessile. The polyp was removed with a cold snare.                            Resection and retrieval were complete. Estimated                            blood loss was minimal.                           The terminal ileum appeared normal.                           Non-bleeding internal hemorrhoids were found during                            retroflexion. The hemorrhoids were small and Grade                            II (internal hemorrhoids that prolapse but reduce                             spontaneously). Complications:            No immediate complications. Estimated Blood Loss:     Estimated blood loss was minimal. Impression:               - Two 3 to 6 mm polyps in the cecum, removed with a                            cold snare. Resected and retrieved.                           - One 4 mm polyp in the transverse colon, removed                            with a cold snare. Resected and retrieved.                           - One 2 mm polyp in  the rectum, removed with a cold                            snare. Resected and retrieved.                           - The examined portion of the ileum was normal.                           - Non-bleeding internal hemorrhoids. Recommendation:           - Patient has a contact number available for                            emergencies. The signs and symptoms of potential                            delayed complications were discussed with the                            patient. Return to normal activities tomorrow.                            Written discharge instructions were provided to the                            patient.                           - Resume previous diet.                           - Continue present medications.                           - Await pathology results.                           - Repeat colonoscopy for surveillance of multiple                            polyps.                           - Return to GI clinic PRN. Doristine Locks, MD 08/11/2023 11:31:14 AM

## 2023-08-11 NOTE — Progress Notes (Signed)
GASTROENTEROLOGY PROCEDURE H&P NOTE   Primary Care Physician: Assunta Found, MD    Reason for Procedure:  Colon Cancer screening  Plan:    Colonoscopy  Patient is appropriate for endoscopic procedure(s) in the ambulatory ([facility]) setting.  The nature of the procedure, as well as the risks, benefits, and alternatives were carefully and thoroughly reviewed with the patient. Ample time for discussion and questions allowed. The patient understood, was satisfied, and agreed to proceed.     HPI: Russell Watkins is a 62 y.o. male who presents for colonoscopy for routine Colon Cancer screening.  No active GI symptoms.  No known family history of colon cancer or related malignancy.  Patient is otherwise without complaints or active issues today.  Past Medical History:  Diagnosis Date   Dyslipidemia    Family history of heart disease    Hyperlipidemia    Insomnia     Past Surgical History:  Procedure Laterality Date   CARDIAC CATHETERIZATION  05/31/2005   normal LV systolic function, normal coronaries (Dr. Evlyn Courier)   COLONOSCOPY     NM MYOCAR PERF WALL MOTION  02/16/2009   bruce myoview; no evidence of significant ischemia, normal pattern of perfusion, post-stress EF 70%   TRANSTHORACIC ECHOCARDIOGRAM  08/19/2002   RV mildly dilated, RA mildly dilated, mild TR    Prior to Admission medications   Medication Sig Start Date End Date Taking? Authorizing Provider  fenofibrate (TRICOR) 145 MG tablet TAKE 1 TABLET BY MOUTH AT BEDTIME 12/18/22  Yes [name], Lisette Abu, MD  rosuvastatin (CRESTOR) 10 MG tablet TAKE 1 TABLET BY MOUTH AT BEDTIME 12/18/22  Yes [name], Lisette Abu, MD  ALPRAZolam Prudy Feeler) 0.5 MG tablet Take 0.5 mg by mouth 3 (three) times daily as needed. 08/16/21   [provider]  Ascorbic Acid (VITAMIN C) 100 MG tablet Take 100 mg by mouth daily.    [provider]  promethazine-dextromethorphan (PROMETHAZINE-DM) 6.25-15 MG/5ML syrup Take 5 mLs by mouth 4 (four)  times daily as needed. 03/29/23   Particia Nearing, PA-C  zolpidem (AMBIEN) 10 MG tablet Take 1 tablet (10 mg total) by mouth at bedtime as needed for sleep. 08/08/20 10/16/22  Wieters, Junius Creamer, PA-C    Current Outpatient Medications  Medication Sig Dispense Refill   fenofibrate (TRICOR) 145 MG tablet TAKE 1 TABLET BY MOUTH AT BEDTIME 90 tablet 3   rosuvastatin (CRESTOR) 10 MG tablet TAKE 1 TABLET BY MOUTH AT BEDTIME 90 tablet 3   ALPRAZolam (XANAX) 0.5 MG tablet Take 0.5 mg by mouth 3 (three) times daily as needed.     Ascorbic Acid (VITAMIN C) 100 MG tablet Take 100 mg by mouth daily.     promethazine-dextromethorphan (PROMETHAZINE-DM) 6.25-15 MG/5ML syrup Take 5 mLs by mouth 4 (four) times daily as needed. 100 mL 0   zolpidem (AMBIEN) 10 MG tablet Take 1 tablet (10 mg total) by mouth at bedtime as needed for sleep. 90 tablet 0   Current Facility-Administered Medications  Medication Dose Route Frequency Provider Last Rate Last Admin   0.9 %  sodium chloride infusion  500 mL Intravenous Once Daiel Strohecker V, DO        Allergies as of 08/11/2023   (No Known Allergies)    Family History  Problem Relation Age of Onset   Breast cancer Mother    Coronary artery disease Mother        CABG   Stroke Paternal Grandfather    Colon polyps Neg Hx  Colon cancer Neg Hx    Esophageal cancer Neg Hx    Rectal cancer Neg Hx    Stomach cancer Neg Hx     Social History   Socioeconomic History   Marital status: Single    Spouse name: Not on file   Number of children: Not on file   Years of education: Not on file   Highest education level: Not on file  Occupational History   Not on file  Tobacco Use   Smoking status: Never   Smokeless tobacco: Never  Vaping Use   Vaping status: Never Used  Substance and Sexual Activity   Alcohol use: Yes    Comment: on weekends   Drug use: No   Sexual activity: Not on file  Other Topics Concern   Not on file  Social History Narrative    Not on file   Social Drivers of Health   Financial Resource Strain: Not on file  Food Insecurity: Not on file  Transportation Needs: Not on file  Physical Activity: Not on file  Stress: Not on file  Social Connections: Not on file  Intimate Partner Violence: Not on file    Physical Exam: Vital signs in last 24 hours: @BP  115/60   Pulse 65   Temp 97.9 F (36.6 C) (Temporal)   Ht 5\' 11"  (1.803 m)   Wt 175 lb (79.4 kg)   SpO2 98%   BMI 24.41 kg/m  GEN: NAD EYE: Sclerae anicteric ENT: MMM CV: Non-tachycardic Pulm: CTA b/l GI: Soft, NT/ND NEURO:  Alert & Oriented x 3   Doristine Locks, DO [facility]   08/11/2023 11:00 AM

## 2023-08-11 NOTE — Patient Instructions (Signed)
Resume previous diet and medications.  Follow up colonoscopy based on pathology results.    YOU HAD AN ENDOSCOPIC PROCEDURE TODAY AT THE [facility]:   Refer to the procedure report that was given to you for any specific questions about what was found during the examination.  If the procedure report does not answer your questions, please call your gastroenterologist to clarify.  If you requested that your care partner not be given the details of your procedure findings, then the procedure report has been included in a sealed envelope for you to review at your convenience later.  YOU SHOULD EXPECT: Some feelings of bloating in the abdomen. Passage of more gas than usual.  Walking can help get rid of the air that was put into your GI tract during the procedure and reduce the bloating. If you had a lower endoscopy (such as a colonoscopy or flexible sigmoidoscopy) you may notice spotting of blood in your stool or on the toilet paper. If you underwent a bowel prep for your procedure, you may not have a normal bowel movement for a few days.  Please Note:  You might notice some irritation and congestion in your nose or some drainage.  This is from the oxygen used during your procedure.  There is no need for concern and it should clear up in a day or so.  SYMPTOMS TO REPORT IMMEDIATELY:  Following lower endoscopy (colonoscopy or flexible sigmoidoscopy):  Excessive amounts of blood in the stool  Significant tenderness or worsening of abdominal pains  Swelling of the abdomen that is new, acute  Fever of 100F or higher   For urgent or emergent issues, a gastroenterologist can be reached at any hour by calling [phone]. Do not use MyChart messaging for urgent concerns.    DIET:  We do recommend a small meal at first, but then you may proceed to your regular diet.  Drink plenty of fluids but you should avoid alcoholic beverages for 24 hours.  ACTIVITY:  You should plan to take it easy  for the rest of today and you should NOT DRIVE or use heavy machinery until tomorrow (because of the sedation medicines used during the test).    FOLLOW UP: Our staff will call the number listed on your records the next business day following your procedure.  We will call around 7:15- 8:00 am to check on you and address any questions or concerns that you may have regarding the information given to you following your procedure. If we do not reach you, we will leave a message.     If any biopsies were taken you will be contacted by phone or by letter within the next 1-3 weeks.  Please call us at [phone] if you have not heard about the biopsies in 3 weeks.    SIGNATURES/CONFIDENTIALITY: You and/or your care partner have signed paperwork which will be entered into your electronic medical record.  These signatures attest to the fact that that the information above on your After Visit Summary has been reviewed and is understood.  Full responsibility of the confidentiality of this discharge information lies with you and/or your care-partner.

## 2023-08-12 ENCOUNTER — Telehealth: Payer: Self-pay | Admitting: *Deleted

## 2023-08-12 NOTE — Telephone Encounter (Signed)
Attempted post procedure follow up call.  No answer -LVM.

## 2023-08-18 DIAGNOSIS — E663 Overweight: Secondary | ICD-10-CM | POA: Diagnosis not present

## 2023-08-18 DIAGNOSIS — F419 Anxiety disorder, unspecified: Secondary | ICD-10-CM | POA: Diagnosis not present

## 2023-08-18 DIAGNOSIS — Z6826 Body mass index (BMI) 26.0-26.9, adult: Secondary | ICD-10-CM | POA: Diagnosis not present

## 2023-08-18 DIAGNOSIS — G4700 Insomnia, unspecified: Secondary | ICD-10-CM | POA: Diagnosis not present

## 2023-08-19 LAB — SURGICAL PATHOLOGY

## 2023-08-21 ENCOUNTER — Encounter: Payer: Self-pay | Admitting: Gastroenterology

## 2023-09-22 ENCOUNTER — Encounter: Payer: Self-pay | Admitting: Internal Medicine

## 2023-09-23 MED ORDER — FENOFIBRATE 145 MG PO TABS: 145.0000 mg | ORAL_TABLET | Freq: Every day | ORAL | 0 refills | Status: DC

## 2023-09-23 MED ORDER — ROSUVASTATIN CALCIUM 10 MG PO TABS: 10.0000 mg | ORAL_TABLET | Freq: Every day | ORAL | 0 refills | Status: DC

## 2023-09-29 ENCOUNTER — Other Ambulatory Visit (HOSPITAL_COMMUNITY): Payer: Self-pay

## 2023-09-29 ENCOUNTER — Telehealth: Payer: Self-pay

## 2023-09-29 NOTE — Telephone Encounter (Signed)
 Pharmacy Patient Advocate Encounter   Received notification from CoverMyMeds that prior authorization for ROSUVASTATIN is required/requested.   Insurance verification completed.   The patient is insured through [facility] .   Per test claim:  ATORVASTATIN is preferred by the insurance.  If suggested medication is appropriate, Please send in a new RX and discontinue this one. If not, please advise as to why it's not appropriate so that we may request a Prior Authorization. Please note, some preferred medications may still require a PA.  If the suggested medications have not been trialed and there are no contraindications to their use, the PA will not be submitted, as it will not be approved

## 2023-09-30 ENCOUNTER — Encounter: Payer: Self-pay | Admitting: Pharmacist

## 2023-09-30 ENCOUNTER — Other Ambulatory Visit (HOSPITAL_COMMUNITY): Payer: Self-pay

## 2023-09-30 MED ORDER — ROSUVASTATIN CALCIUM 10 MG PO TABS
10.0000 mg | ORAL_TABLET | Freq: Every day | ORAL | 1 refills | Status: DC
  Filled 2023-09-30: qty 30, 30d supply, fill #0

## 2023-09-30 NOTE — Telephone Encounter (Signed)
LVM for pt to call back  Can switch to atorvastatin or he can pay $5 for rosuvastatin at [facility] and they can mail to him.

## 2023-10-07 ENCOUNTER — Other Ambulatory Visit (HOSPITAL_COMMUNITY): Payer: Self-pay

## 2023-10-09 LAB — NMR, LIPOPROFILE
Cholesterol, Total: 161 mg/dL (ref 100–199)
HDL Particle Number: 37.1 umol/L (ref >=30.5)
HDL-C: 44 mg/dL (ref >39)
LDL Particle Number: 1368 nmol/L — ABNORMAL HIGH (ref <1000)
LDL Size: 20.4 nmol — ABNORMAL LOW (ref >20.5)
LDL-C (NIH Calc): 102 mg/dL — ABNORMAL HIGH (ref 0–99)
LP-IR Score: 77 — ABNORMAL HIGH (ref <=45)
Small LDL Particle Number: 648 nmol/L — ABNORMAL HIGH (ref <=527)
Triglycerides: 76 mg/dL (ref 0–149)

## 2023-10-10 ENCOUNTER — Encounter: Payer: Self-pay | Admitting: Cardiovascular Disease

## 2023-10-14 NOTE — Progress Notes (Unsigned)
 Cardiology Office Note:  .   Date:  10/15/2023  ID:  Russell Watkins, DOB 01-Apr-1961, MRN [id number] PCP: Assunta Found, MD  [facility] Providers Cardiologist:  Chrystie Nose, MD    Patient Profile: .      PMH Coronary artery disease Cardiac cath 05/2005 Normal coronaries Nuclear stress test 02/2009 Normal, EF 70 CT Calcium score 07/2020 CAC Score of 0 Dyslipidemia Hypertension Family history of heart disease  Referred to cardiology initially at age 63 years due to family history of heart disease and his history of dyslipidemia. He had marked weight loss and was no longer hypertensive. He was on Crestor 10 mg at the time of the referral which was changed to pravastatin after a lipid profile in January 2013 which showed LDL particle number of 1486, LDL of 62, triglycerides 246 and LDL particle #877.  He had also been started on fenofibrate 145 mg daily.  Follow-up lipid panel January 2014 showed excellent improvement.  He has maintained consistent follow-up with Dr. Rennis Golden.  CT calcium score 07/2020 was 0.  Last cardiology clinic visit was with Dr. Rennis Golden on 10/16/2022. He reported stress in caring for his mother.  He also had experienced recent weight gain and wanted to improve his diet and exercise regimen.  BP was well-controlled.  NMR at that time revealed LDL particle number 1184, LDL-C 84, HDL-C 48, triglycerides 81, total cholesterol 148, and small LDL particle number 573. He was continued on fenofibrate 145 mg daily and rosuvastatin 10 mg daily.        History of Present Illness: .   Russell Watkins is a very pleasant 63 y.o. male who is here today for follow-up of dyslipidemia. NMR 10/08/2023: LDL particle number 1368, LDL-C 102, HDL-C 44, triglycerides 76, total cholesterol 161, and small LDL particle number 648. 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 This  responsibility significantly impacted his lifestyle, leading to a decline in his diet and exercise habits. His mother passed away in 2024-09-28and he is now trying to return to a healthier lifestyle. He was also diagnosed with pre-diabetes. He has a family history of stroke but attributes it family members who were heavy smokers. He is motivated to improve his health through diet and exercise and is interested in seeing his "true numbers" through blood work. He has recently set up a gym and is doing a better job with meal prep and planning. Admits he has a pension for double cheeseburgers and ice cream, but is limiting these. He is aiming for a 50% improvement in his diet and exercise habits. He denies chest pain, shortness of breath, lower extremity edema, fatigue, palpitations, presyncope, syncope, orthopnea, and PND.   Discussed the use of AI scribe software for clinical note transcription with the patient, who gave verbal consent to proceed.   ROS: See HPI       Studies Reviewed: Marland Kitchen   EKG Interpretation Date/Time:  Wednesday October 15 2023 11:35:16 EST Ventricular Rate:  64 PR Interval:  156 QRS Duration:  88 QT Interval:  392 QTC Calculation: 404 R Axis:   53  Text Interpretation: Normal sinus rhythm Normal ECG No previous ECGs available Confirmed by Eligha Bridegroom [phone]) on 10/15/2023 11:39:22 AM    Risk Assessment/Calculations:             Physical Exam:   VS:  BP 128/78 (BP Location: Right Arm, Patient Position: Sitting)   Pulse 65   Ht 5\' 11"  (1.803 m)   Wt 185 lb 12.8 oz (84.3 kg)   SpO2 98%   BMI 25.91 kg/m    Wt Readings from Last 3 Encounters:  10/15/23 185 lb 12.8 oz (84.3 kg)  08/11/23 175 lb (79.4 kg)  07/24/23 175 lb (79.4 kg)    GEN: Well nourished, well developed in no acute distress NECK: No JVD; No carotid bruits CARDIAC: RRR, no murmurs, rubs, gallops RESPIRATORY:  Clear to auscultation without rales, wheezing or rhonchi  ABDOMEN: Soft,  non-tender, non-distended EXTREMITIES:  No edema; No deformity     ASSESSMENT AND PLAN: .    Dyslipidemia LDL goal < 100: Lipid panel completed 10/08/2023 revealed LDL particle number 1368, LDL-C 102, HDL-C 44, triglycerides 76, total cholesterol 161, and small LDL particle number 648. He admits to lifestyle changes over the past 3 years with the full time care of his mother, dietary indiscretion, and inconsistent exercise are contributory.  He has already resumed healthier diet and regular exercise.  No concerning side effects from medications, however his insurance has changed and has requested that he take atorvastatin instead of rosuvastatin.  He was able to get rosuvastatin at a low cash price, therefore he will continue rosuvastatin 10 mg.  He was also advised that insurance would not cover fenofibrate.  We do lengthy discussion about history of elevated triglycerides which have been well-controlled over the past several years.  He will dc fenofibrate and we will recheck NMR lipid profile in 6 months.   Family history early CAD/Cardiac risk assessment: EKG today reveals normal sinus rhythm at 64 bpm, no ST abnormality. CT calcium score of 0 in December 2021. He denies chest pain, dyspnea, or other symptoms concerning for angina.  No indication for further ischemic evaluation at this time. Continue to focus on prevention including heart healthy mostly plant based diet avoiding saturated fat, processed foods, simple carbohydrates, and sugar along with aiming for at least 150 minutes of moderate intensity exercise each week.        Disposition:6 months with Dr. Rennis Golden or me  Signed, Eligha Bridegroom, NP-C

## 2023-10-15 ENCOUNTER — Other Ambulatory Visit (HOSPITAL_COMMUNITY): Payer: Self-pay

## 2023-10-15 ENCOUNTER — Ambulatory Visit (HOSPITAL_BASED_OUTPATIENT_CLINIC_OR_DEPARTMENT_OTHER): Payer: 59 | Admitting: Nurse Practitioner

## 2023-10-15 ENCOUNTER — Encounter (HOSPITAL_BASED_OUTPATIENT_CLINIC_OR_DEPARTMENT_OTHER): Payer: Self-pay | Admitting: Nurse Practitioner

## 2023-10-15 VITALS — BP 128/78 | HR 65 | Ht 71.0 in | Wt 185.8 lb

## 2023-10-15 DIAGNOSIS — Z7189 Other specified counseling: Secondary | ICD-10-CM

## 2023-10-15 DIAGNOSIS — Z8249 Family history of ischemic heart disease and other diseases of the circulatory system: Secondary | ICD-10-CM

## 2023-10-15 DIAGNOSIS — E785 Hyperlipidemia, unspecified: Secondary | ICD-10-CM

## 2023-10-15 MED ORDER — ROSUVASTATIN CALCIUM 10 MG PO TABS
10.0000 mg | ORAL_TABLET | Freq: Every day | ORAL | 3 refills | Status: AC
  Filled 2023-10-15 – 2023-10-30 (×2): qty 90, 90d supply, fill #0
  Filled 2024-01-29 – 2024-02-09 (×2): qty 90, 90d supply, fill #1
  Filled 2024-05-16: qty 90, 90d supply, fill #2

## 2023-10-15 NOTE — Patient Instructions (Signed)
 Medication Instructions:   DISCONTINUE Fenofibrate.   *If you need a refill on your cardiac medications before your next appointment, please call your pharmacy*   Lab Work:  Your physician recommends that you return for a FASTING NMR in 6 months fasting after midnight prior to your 6 month appointment. Patient given paperwork today.    If you have labs (blood work) drawn today and your tests are completely normal, you will receive your results only by: MyChart Message (if you have MyChart) OR A paper copy in the mail If you have any lab test that is abnormal or we need to change your treatment, we will call you to review the results.   Testing/Procedures:  None ordered.     Follow-Up: At [facility], you and your health needs are our priority.  As part of our continuing mission to provide you with exceptional heart care, we have created designated Provider Care Teams.  These Care Teams include your primary Cardiologist (physician) and Advanced Practice Providers (APPs -  Physician Assistants and Nurse Practitioners) who all work together to provide you with the care you need, when you need it.  We recommend signing up for the patient portal called "MyChart".  Sign up information is provided on this After Visit Summary.  MyChart is used to connect with patients for Virtual Visits (Telemedicine).  Patients are able to view lab/test results, encounter notes, upcoming appointments, etc.  Non-urgent messages can be sent to your provider as well.   To learn more about what you can do with MyChart, go to [email or web address].    Your next appointment:   6 month(s)  Provider:   K. Italy [name], MD or Eligha Bridegroom, NP    Other Instructions  Your physician wants you to follow-up in: 6 months.  You will receive a reminder letter in the mail two months in advance. If you don't receive a letter, please call our office to schedule the follow-up appointment.

## 2023-10-16 ENCOUNTER — Encounter (HOSPITAL_COMMUNITY): Payer: Self-pay

## 2023-10-16 ENCOUNTER — Other Ambulatory Visit (HOSPITAL_COMMUNITY): Payer: Self-pay

## 2023-10-29 ENCOUNTER — Other Ambulatory Visit (HOSPITAL_COMMUNITY): Payer: Self-pay

## 2023-10-30 ENCOUNTER — Other Ambulatory Visit: Payer: Self-pay

## 2023-10-30 ENCOUNTER — Other Ambulatory Visit (HOSPITAL_COMMUNITY): Payer: Self-pay

## 2023-11-25 ENCOUNTER — Other Ambulatory Visit (HOSPITAL_COMMUNITY): Payer: Self-pay

## 2024-01-29 ENCOUNTER — Other Ambulatory Visit (HOSPITAL_COMMUNITY): Payer: Self-pay

## 2024-02-09 ENCOUNTER — Other Ambulatory Visit (HOSPITAL_COMMUNITY): Payer: Self-pay

## 2024-02-09 ENCOUNTER — Encounter (HOSPITAL_COMMUNITY): Payer: Self-pay

## 2024-02-13 ENCOUNTER — Other Ambulatory Visit (HOSPITAL_COMMUNITY): Payer: Self-pay

## 2024-03-17 LAB — NMR, LIPOPROFILE
Cholesterol, Total: 160 mg/dL (ref 100–199)
HDL Particle Number: 39.2 umol/L (ref >=30.5)
HDL-C: 54 mg/dL (ref >39)
LDL Particle Number: 1066 nmol/L — ABNORMAL HIGH (ref <1000)
LDL Size: 20.9 nm (ref >20.5)
LDL-C (NIH Calc): 90 mg/dL (ref 0–99)
LP-IR Score: 54 — ABNORMAL HIGH (ref <=45)
Small LDL Particle Number: 526 nmol/L (ref <=527)
Triglycerides: 88 mg/dL (ref 0–149)

## 2024-03-18 ENCOUNTER — Ambulatory Visit: Payer: Self-pay | Admitting: Nurse Practitioner

## 2024-07-01 ENCOUNTER — Telehealth: Payer: Self-pay | Admitting: Internal Medicine

## 2024-07-01 ENCOUNTER — Encounter (HOSPITAL_BASED_OUTPATIENT_CLINIC_OR_DEPARTMENT_OTHER): Payer: Self-pay

## 2024-07-01 NOTE — Telephone Encounter (Signed)
  Patient will need lab orders placed so that he can get labs for lipid done before his appt in February 2026

## 2024-07-01 NOTE — Telephone Encounter (Signed)
 If patient increased rosuvastatin  to 20 mg daily, then we need to repeat NMR and ALT prior to February appointment. If he did not increase rosuvastatin , no need to repeat labs.

## 2024-07-01 NOTE — Telephone Encounter (Signed)
 Left the pt a message to call the office back to endorse lab recommendations per Rosaline Bane, NP.

## 2024-07-01 NOTE — Telephone Encounter (Signed)
 Mychart message exchange sent to the pt about recommendations from Rosaline Bane, NP.  Will await pts response on how he is currently taking crestor .

## 2024-07-02 NOTE — Telephone Encounter (Signed)
 Patient viewed message in [facility], sent another message to follow up

## 2024-07-05 NOTE — Telephone Encounter (Signed)
 Addressed via MyChart message. follow up labs and visit have been moved to 02/2025.   Russell Baumgartner S Tyshan Enderle, NP

## 2024-09-22 ENCOUNTER — Encounter (HOSPITAL_BASED_OUTPATIENT_CLINIC_OR_DEPARTMENT_OTHER): Admitting: Nurse Practitioner
